# Patient Record
Sex: Male | Born: 1950 | Race: Black or African American | Hispanic: No | Marital: Single | Smoking: Never smoker
Health system: Southern US, Community
[De-identification: ages and names within clinical notes are randomized; demographics above are authoritative.]

## PROBLEM LIST (undated history)

## (undated) DIAGNOSIS — I10 Essential (primary) hypertension: Secondary | ICD-10-CM

## (undated) DIAGNOSIS — A6002 Herpesviral infection of other male genital organs: Secondary | ICD-10-CM

## (undated) DIAGNOSIS — E785 Hyperlipidemia, unspecified: Secondary | ICD-10-CM

## (undated) DIAGNOSIS — N529 Male erectile dysfunction, unspecified: Secondary | ICD-10-CM

## (undated) DIAGNOSIS — G4730 Sleep apnea, unspecified: Secondary | ICD-10-CM

## (undated) HISTORY — DX: Essential (primary) hypertension: I10

## (undated) HISTORY — DX: Herpesviral infection of other male genital organs: A60.02

## (undated) HISTORY — DX: Male erectile dysfunction, unspecified: N52.9

## (undated) HISTORY — DX: Hyperlipidemia, unspecified: E78.5

## (undated) HISTORY — DX: Sleep apnea, unspecified: G47.30

## (undated) HISTORY — PX: COLONOSCOPY: SHX174

---

## 2001-06-25 ENCOUNTER — Emergency Department (HOSPITAL_COMMUNITY): Admission: EM | Admit: 2001-06-25 | Discharge: 2001-06-25 | Payer: Self-pay | Admitting: Emergency Medicine

## 2001-08-31 ENCOUNTER — Emergency Department (HOSPITAL_COMMUNITY): Admission: EM | Admit: 2001-08-31 | Discharge: 2001-08-31 | Payer: Self-pay | Admitting: Emergency Medicine

## 2001-08-31 ENCOUNTER — Encounter: Payer: Self-pay | Admitting: Emergency Medicine

## 2001-09-05 ENCOUNTER — Ambulatory Visit (HOSPITAL_COMMUNITY): Admission: RE | Admit: 2001-09-05 | Discharge: 2001-09-05 | Payer: Self-pay | Admitting: Specialist

## 2001-09-05 ENCOUNTER — Encounter: Payer: Self-pay | Admitting: Specialist

## 2005-06-14 ENCOUNTER — Encounter: Admission: RE | Admit: 2005-06-14 | Discharge: 2005-07-01 | Payer: Self-pay | Admitting: Family Medicine

## 2005-11-23 ENCOUNTER — Encounter: Admission: RE | Admit: 2005-11-23 | Discharge: 2005-11-23 | Payer: Self-pay | Admitting: Family Medicine

## 2006-05-19 ENCOUNTER — Ambulatory Visit: Payer: Self-pay | Admitting: Family Medicine

## 2006-05-23 HISTORY — PX: CARDIAC CATHETERIZATION: SHX172

## 2006-05-30 LAB — HM COLONOSCOPY: HM Colonoscopy: NORMAL

## 2006-06-01 ENCOUNTER — Ambulatory Visit: Payer: Self-pay | Admitting: Internal Medicine

## 2006-06-02 ENCOUNTER — Ambulatory Visit: Payer: Self-pay | Admitting: Internal Medicine

## 2006-06-02 LAB — CONVERTED CEMR LAB
BUN: 17 mg/dL (ref 6–23)
CO2: 29 meq/L (ref 19–32)
Calcium: 9.6 mg/dL (ref 8.4–10.5)
Chloride: 104 meq/L (ref 96–112)
Creatinine, Ser: 1.2 mg/dL (ref 0.4–1.5)
GFR calc non Af Amer: 67 mL/min
Glomerular Filtration Rate, Af Am: 81 mL/min/{1.73_m2}
Glucose, Bld: 117 mg/dL — ABNORMAL HIGH (ref 70–99)
HCT: 40.1 % (ref 39.0–52.0)
Hemoglobin: 13.9 g/dL (ref 13.0–17.0)
INR: 0.9 (ref 0.9–2.0)
MCHC: 34.6 g/dL (ref 30.0–36.0)
MCV: 94.3 fL (ref 78.0–100.0)
Platelets: 233 10*3/uL (ref 150–400)
Potassium: 3.9 meq/L (ref 3.5–5.1)
Prothrombin Time: 12 s (ref 10.0–14.0)
RBC: 4.25 M/uL (ref 4.22–5.81)
RDW: 11.8 % (ref 11.5–14.6)
Sodium: 139 meq/L (ref 135–145)
WBC: 7.1 10*3/uL (ref 4.5–10.5)
aPTT: 29.3 s (ref 26.5–36.5)

## 2006-06-03 ENCOUNTER — Inpatient Hospital Stay (HOSPITAL_BASED_OUTPATIENT_CLINIC_OR_DEPARTMENT_OTHER): Admission: RE | Admit: 2006-06-03 | Discharge: 2006-06-03 | Payer: Self-pay | Admitting: Internal Medicine

## 2006-06-03 ENCOUNTER — Ambulatory Visit: Payer: Self-pay | Admitting: Internal Medicine

## 2006-06-07 ENCOUNTER — Ambulatory Visit (HOSPITAL_COMMUNITY): Admission: RE | Admit: 2006-06-07 | Discharge: 2006-06-07 | Payer: Self-pay | Admitting: Gastroenterology

## 2006-06-14 ENCOUNTER — Ambulatory Visit: Payer: Self-pay | Admitting: Internal Medicine

## 2007-05-22 ENCOUNTER — Ambulatory Visit: Payer: Self-pay | Admitting: Family Medicine

## 2007-09-21 ENCOUNTER — Ambulatory Visit: Payer: Self-pay | Admitting: Family Medicine

## 2008-02-02 ENCOUNTER — Ambulatory Visit: Payer: Self-pay | Admitting: Family Medicine

## 2008-05-27 ENCOUNTER — Ambulatory Visit: Payer: Self-pay | Admitting: Family Medicine

## 2008-06-03 ENCOUNTER — Ambulatory Visit: Payer: Self-pay | Admitting: Family Medicine

## 2008-09-30 ENCOUNTER — Ambulatory Visit: Payer: Self-pay | Admitting: Family Medicine

## 2009-07-07 ENCOUNTER — Ambulatory Visit: Payer: Self-pay | Admitting: Family Medicine

## 2010-04-15 ENCOUNTER — Ambulatory Visit: Payer: Self-pay | Admitting: Family Medicine

## 2010-07-21 ENCOUNTER — Ambulatory Visit: Payer: Self-pay | Admitting: Family Medicine

## 2010-08-03 ENCOUNTER — Ambulatory Visit: Payer: Self-pay | Admitting: Family Medicine

## 2010-08-03 ENCOUNTER — Encounter
Admission: RE | Admit: 2010-08-03 | Discharge: 2010-08-03 | Payer: Self-pay | Source: Home / Self Care | Attending: Family Medicine | Admitting: Family Medicine

## 2010-08-10 ENCOUNTER — Ambulatory Visit: Payer: Self-pay | Admitting: Family Medicine

## 2010-12-11 ENCOUNTER — Ambulatory Visit: Payer: Self-pay | Admitting: Family Medicine

## 2010-12-16 ENCOUNTER — Ambulatory Visit (INDEPENDENT_AMBULATORY_CARE_PROVIDER_SITE_OTHER): Payer: PRIVATE HEALTH INSURANCE | Admitting: Family Medicine

## 2010-12-16 DIAGNOSIS — Z79899 Other long term (current) drug therapy: Secondary | ICD-10-CM

## 2010-12-16 DIAGNOSIS — I10 Essential (primary) hypertension: Secondary | ICD-10-CM

## 2010-12-16 DIAGNOSIS — E782 Mixed hyperlipidemia: Secondary | ICD-10-CM

## 2010-12-16 DIAGNOSIS — E119 Type 2 diabetes mellitus without complications: Secondary | ICD-10-CM

## 2011-01-08 NOTE — Assessment & Plan Note (Signed)
[facility]                                   ON-CALL NOTE   NAME:Jacob Swanson, Jacob Swanson                         MRN:          [id number]  DATE:06/01/2006                            DOB:          31-Jul-1951    PRIMARY CARDIOLOGIST:  Jonelle Sidle, MD.   [name] called this evening because he was told earlier by Dr. Gala Romney  that he had an abnormal stress test and would require left heart cardiac  catheterization on Friday.  He initially was scheduled to have laboratory  work drawn tomorrow at 0830 hours, at the office, however Mr. Life has now  realized that he had a previous engagement, until about 1530 hours tomorrow.  I advised that I would call our office and leave a message, which I have  already done, to inform them that he would not be there at 0830 hours and to  expect him at approximately 1600 hours for laboratory work.   He also had a question regarding the catheterization procedure.  I spent  about 15 minutes on the phone with him, explaining the procedure, what we  are looking for, and what we do if we find coronary disease.  He verbalized  understanding and was grateful for the time spent on the phone.      ______________________________  Nicolasa Ducking, ANP    ______________________________  Jonelle Sidle, MD     CB/MedQ  DD:  06/01/2006  DT:  06/03/2006  Job #:  [id number]

## 2011-01-08 NOTE — Assessment & Plan Note (Signed)
[facility]                              CARDIOLOGY OFFICE NOTE   NAME:Jacob Swanson, Jacob Swanson                         MRN:          [id number]  DATE:06/14/2006                            DOB:          09-Apr-1951    This is a 60 year old African-American male patient of Dr. Gala Romney and  Sharlot Gowda, M.D. who presented with some dyspnea on exertion.  He is a  very active runner, running 5-6 miles several days a week.  He had a stress  test in which he exercised 10 minutes of the Bruce protocol, but had some  mild ST elevation, aVR and aVL, and mild ST depression in leads 3 and aVL.  Perfusion imaging was totally normal, however, there was mild dilatation of  the ventricle with slightly low normal ejection fraction at 50% with global  hypokinesis.  There was no transient ischemic dilatation, but given the  results, there was some concern about global ischemia and potential three  vessel or left main disease.  Because of this, cardiac catheterization was  recommended and performed on June 03, 2006.  This revealed totally normal  coronary arteries and LV function.   The patient is doing well, no chest pain.  He has mild shortness of breath  when he goes up a flight of stairs, but he has no concerns.   CURRENT MEDICATIONS:  1. Zestoretic 20/12.5 mg daily.  2. Zocor 20 mg daily.  3. Metformin 500 mg b.i.d.   PHYSICAL EXAMINATION:  GENERAL:  This is a pleasant 60 year old African-  American male in no acute distress.  VITAL SIGNS:  Blood pressure 144/90, pulse 76, weight 199.  NECK:  Without JVD, HDR, bruit, or thyroid enlargement.  LUNGS:  Clear anterior, posterior, and lateral.  HEART:  Regular rate and rhythm at 76 beats per minute.  Normal S1 and S2.  No murmur, rub, bruit, thrill, or heave noted.  ABDOMEN:  Soft without organomegaly, masses, lesions, or abnormal  tenderness.  Right groin is stable without hematoma or hemorrhage.  EXTREMITIES:   Without cyanosis, clubbing, or edema.  There are good distal  pulses.   IMPRESSION:  1. False positive stress Cardiolite with normal coronary arteries and left      ventricular function on catheterization on June 03, 2006.  2. Hypertension.  3. Hyperlipidemia.  4. Diabetes mellitus.   PLAN:  The patient was rushing here and was in bad traffic and claims this  is why his blood pressure is up.  I will let Dr. Susann Givens follow both his  blood pressure and diabetes, and hyperlipidemia and he can follow up with [place]  as needed.     ______________________________  Jacolyn Reedy, PA-C    ______________________________  Bevelyn Buckles. Bensimhon, MD   ML/MedQ  DD: 06/14/2006  DT: 06/15/2006  Job #: [id number]   cc:   Sharlot Gowda, M.D.

## 2011-01-08 NOTE — Assessment & Plan Note (Signed)
[facility]                              CARDIOLOGY OFFICE NOTE   NAME:Gonce, MAHAD                         MRN:          [id number]  DATE:06/03/2006                            DOB:          02/02/1951    ADDENDUM   d   PHYSICAL EXAMINATION:  GENERAL APPEARANCE:  The patient is well-appearing,  in no acute distress.  Able to ambulate without any respiratory difficulty.  VITAL SIGNS:  Blood pressure 152/91.  Heart rate 68.  He is sating 99% on  room air.  HEENT:  Sclerae anicteric.  EOMI.  There is no xanthelasma.  His mucous  membranes are moist.  NECK:  Supple, no JVD.  Carotids are 2+ bilaterally without bruits.  There  is no lymphadenopathy or thyromegaly.  CARDIAC:  He is bradycardic and regular with no murmurs, rubs or gallops.  LUNGS:  Clear.  ABDOMEN:  Soft, nontender, nondistended.  No hepatosplenomegaly, no bruits,  no masses.  Good bowel sounds.  EXTREMITIES:  Warm with no cyanosis, clubbing or edema.  Femoral pulses are  2+ bilaterally without any bruits.  Dorsalis pedis  pulses are 2+.  NEUROLOGICAL:  He is alert and oriented x3.  Pleasant affect.  Cranial  nerves II-XII are intact.  Moves all four extremities without difficulty.  SKIN:  There are no rashes or significant arthropathies.       Bevelyn Buckles. Bensimhon, MD     DRB/MedQ  DD:  06/03/2006  DT:  06/04/2006  Job #:  [id number]

## 2011-01-08 NOTE — Assessment & Plan Note (Signed)
[facility]                              CARDIOLOGY OFFICE NOTE   NAME:RUCKERElizeo, Rodriques                         MRN:          [id number]  DATE:06/01/2006                            DOB:          1951-02-02    CONSULTATION   PRIMARY CARE PHYSICIAN:  Sharlot Gowda, M.D.   REASON FOR CONSULTATION:  Abnormal stress test.   HISTORY OF PRESENT ILLNESS:  Mr. Groeneveld is a delightful 60 year old male  with a history of hypertension, hyperlipidemia and diabetes.  He denies any  known history or family history of coronary artery disease.  He has been  quite active recently running in multiple local running races.  He has not  had any significant problems with this.  However, he has noticed that he  does get some dyspnea while walking up steps.  He went to Dr. Susann Givens for  his routine physical and given his risk factors, he was scheduled for  treadmill Myoview test.  This test was performed on Wednesday.  During the  test he walked over 10 minutes with standard Bruce protocol.  He did not  have any chest pain.  However, at peak exercise there was mild ST elevation  in aVR and aVL.  There was also some mild ST depression in leads III and  aVL.  Perfusion imaging was totally normal, however, there was mild dilation  of the ventricle with slightly low normal ejection fraction at 50% with  global hypokinesis.  There was no transient ischemic dilatation.  Given the  results of his tests, there was some concern, albeit mild, about global  ischemia and potential of three vessel or left main disease.  I called him  at home to discuss this and expressed my concern.  I also reinforced the  fact that these findings were somewhat equivocal.  Given his risk factors  and level of activity, I thought it was prudent to proceed with cardiac  catheterization to make sure he did not have a high grade proximal lesion.   REVIEW OF SYSTEMS:  He notes some mild arthralgias as well as  sexual  dysfunction.  The remainder of his review of systems is negative.  He denies  palpitations. He has not had chest pain.  No heart failure symptoms.  No  claudication.  No bright red blood per rectum.  No bleeding, no melena.  No  neurological problems.   PAST MEDICAL HISTORY:  1. Diabetes.  2. Hypertension.  3. Hyperlipidemia.   CURRENT MEDICATIONS:  1. Zestoretic 20/12.5.  2. Zocor 20.  3. Metformin ER 500 mg b.i.d.  4. Aspirin 81 mg.   ALLERGIES:  No known drug allergies.   SOCIAL HISTORY:  The patient is single.  He lives in [place].  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  He has a  history of smoking; he smoked about 1 pack per day for two years and quit in  2004.  He drinks alcohol about one drink a day.  He is very active running  in local running races, doing step exercises and lifting weights.   FAMILY HISTORY:  Mother died at 36 due to kidney problems.  Father is 34 and  alive and well.  He works every day.  He does not have any history of heart  disease.  He has two sisters and two brothers who are healthy.  There is no  family history of sudden cardiac death or premature coronary artery disease.   PHYSICAL EXAMINATION:  GENERAL APPEARANCE:  The patient is well-appearing,  in no acute distress.  Able to ambulate without any respiratory difficulty.  VITAL SIGNS:  Blood pressure 152/91.  Heart rate 68.  He is sating 99% on  room air.  HEENT:  Sclerae anicteric.  EOMI.  There is no xanthelasma.  His mucous  membranes are moist.  NECK:  Supple, no JVD.  Carotids are 2+ bilaterally without bruits.  There  is no lymphadenopathy or thyromegaly.  CARDIAC:  He is bradycardic and regular with no murmurs, rubs or gallops.  LUNGS:  Clear.  ABDOMEN:  Soft, nontender, nondistended.  No hepatosplenomegaly, no bruits,  no masses.  Good bowel sounds.  EXTREMITIES:  Warm  with no cyanosis, clubbing or edema.  Femoral pulses are  2+ bilaterally without any bruits.  Dorsalis pedis  pulses are 2+.  NEUROLOGICAL:  He is alert and oriented x3.  Pleasant affect.  Cranial  nerves II-XII are intact.  Moves all four extremities without difficulty.  SKIN:  There are no rashes or significant arthropathies.   CLINICAL DATA:  EKG shows sinus bradycardia with a first degree AV block at  a rate of 57.  There are no significant ST-T wave changes.  Sodium is 139,  potassium 3.9, chloride 104, bicarb 29, glucose 117, BUN 17, creatinine 1.2.  INR is 0.9.  The white cell count is 7.1, hemoglobin 13.9, platelet count  233,000.   ASSESSMENT:  1. Abnormal stress test as described above.  2. Hypertension.  3. Hyperlipidemia.  4. Diabetes.   PLAN/DISCUSSION:  As above, despite Mr. Munch excellent apparent physical  condition, he does have a modestly abnormal Myoview with some concern for  global ischemia.  Given his risk factors and high level of activity that he  would like maintain, I do think it is prudent to proceed  with cardiac catheterization to evaluate for any possible high grade  proximal lesions.  I discussed the risks and benefits of this with him at  length and he agrees to proceed.       Bevelyn Buckles. Bensimhon, MD    ZOX#[id number]  DRB/MedQ  DD:  06/03/2006  DT:  06/04/2006  Job #:  [id number]   cc:   Sharlot Gowda, M.D.

## 2011-01-08 NOTE — Cardiovascular Report (Signed)
NAME:  Jacob Swanson, Jacob Swanson NO.:  [id number]   MEDICAL RECORD NO.:  [id number]          PATIENT TYPE:  OIB   LOCATION:  1962                         FACILITY:  [facility]   PHYSICIAN:  Bevelyn Buckles. Bensimhon, MDDATE OF BIRTH:  22-Dec-1950   DATE OF PROCEDURE:  06/03/2006  DATE OF DISCHARGE:  06/03/2006                              CARDIAC CATHETERIZATION   PRIMARY CARE PHYSICIAN:  Dr. Sharlot Gowda.   PATIENT IDENTIFICATION:  Jacob Swanson is a 60 year old male with history of  hypertension, hyperlipidemia and diabetes.  He was referred for routine  stress testing.  This was mildly abnormal and he is thus referred for  diagnostic cardiac catheterization in the outpatient catheterization lab.   PROCEDURES PERFORMED:  1. Selective coronary angiography.  2. Left heart cath.  3. Left ventriculogram.   DESCRIPTION OF PROCEDURE:  The risks and benefits of catheterization were  explained.  Consent was signed and placed on the chart.  A 4-French sheath  was placed in the right femoral artery using the modified Seldinger  technique.  Standard catheters including JL-5, 3DRC and angled pigtail were  used for procedure.  All catheter exchanges made over wire.  There were no  apparent complications.  At the end of the procedure, patient was  transferred to a holding area in stable condition.   Central aortic pressure 136/81 with a mean of 104.  LV pressure 133/7 with  EDP of 18.  There is no aortic stenosis.   Left main was short, angiographically normal.   LAD was a long vessel wrapping the apex.  There was 2 tiny diagonals.  There  is no angiographic disease.   Left circumflex was a large co-dominant vessel, gave off a large OM-1, a  large OM-2, moderate-sized OM-3, a moderate-sized posterolateral and a small  PDA.   Right coronary artery was a moderate-size codominant vessel, gave off a  large branching RV branch and a small PDA.   Left ventriculogram done on the RAO position  showed an EF of 55% with no  wall motion abnormalities or mitral regurgitation.  On panning down over the  abdominal aorta, there is no evidence of aneurysmal dilatation.   ASSESSMENT:  1. Normal coronary arteries.  2. Normal LV function.   PLAN/DISCUSSION:  It appears that he has had a false positive stress test.  He has normal coronary anatomy and normal LV function.  Would proceed with  risk factor management per Dr. Susann Givens.  I have reassured the patient and  asked that he not resume running for about a week, to let his arteriotomy  site time to heal.      Bevelyn Buckles. Bensimhon, MD  Electronically Signed     DRB/MEDQ  D:  06/03/2006  T:  06/04/2006  Job:  [id number]   cc:   Sharlot Gowda, M.D.

## 2011-04-20 ENCOUNTER — Encounter: Payer: Self-pay | Admitting: Family Medicine

## 2011-04-21 ENCOUNTER — Encounter: Payer: Self-pay | Admitting: Family Medicine

## 2011-04-21 ENCOUNTER — Ambulatory Visit (INDEPENDENT_AMBULATORY_CARE_PROVIDER_SITE_OTHER): Payer: BC Managed Care – PPO | Admitting: Family Medicine

## 2011-04-21 DIAGNOSIS — I152 Hypertension secondary to endocrine disorders: Secondary | ICD-10-CM

## 2011-04-21 DIAGNOSIS — Z23 Encounter for immunization: Secondary | ICD-10-CM

## 2011-04-21 DIAGNOSIS — E119 Type 2 diabetes mellitus without complications: Secondary | ICD-10-CM

## 2011-04-21 DIAGNOSIS — Z79899 Other long term (current) drug therapy: Secondary | ICD-10-CM

## 2011-04-21 DIAGNOSIS — Z139 Encounter for screening, unspecified: Secondary | ICD-10-CM

## 2011-04-21 DIAGNOSIS — E1159 Type 2 diabetes mellitus with other circulatory complications: Secondary | ICD-10-CM | POA: Insufficient documentation

## 2011-04-21 DIAGNOSIS — I10 Essential (primary) hypertension: Secondary | ICD-10-CM

## 2011-04-21 DIAGNOSIS — E118 Type 2 diabetes mellitus with unspecified complications: Secondary | ICD-10-CM | POA: Insufficient documentation

## 2011-04-21 DIAGNOSIS — E785 Hyperlipidemia, unspecified: Secondary | ICD-10-CM

## 2011-04-21 DIAGNOSIS — E1169 Type 2 diabetes mellitus with other specified complication: Secondary | ICD-10-CM | POA: Insufficient documentation

## 2011-04-21 LAB — LIPID PANEL
Cholesterol: 198 mg/dL (ref 0–200)
Total CHOL/HDL Ratio: 4.1 Ratio
VLDL: 15 mg/dL (ref 0–40)

## 2011-04-21 NOTE — Progress Notes (Signed)
  Subjective:    Patient ID: Jacob Swanson, male    DOB: March 06, 1951, 60 y.o.   MRN: [id number]  HPI He is here for a diabetes recheck. He continues to take excellent care of himself. His blood sugars run in the low 100s. He exercises regularly. He continues on medications listed in the chart. He has had an eye exam within the last year. He does check his feet regularly. He does not smoke and drinks socially. His work continues to go well. His statin was increased with his last visit. He had no difficulty with this.   Review of Systems     Objective:   Physical Exam Alert and in no distress. Hemoglobin A1c is 6.5       Assessment & Plan:  Diabetes. Hypertension. Hyperlipidemia. Flu shot given Continue take excellent care of himself. Return here in 4 months.

## 2011-04-22 LAB — HEPATITIS B SURFACE ANTIBODY,QUALITATIVE: Hep B S Ab: NEGATIVE

## 2011-07-01 ENCOUNTER — Other Ambulatory Visit: Payer: Self-pay | Admitting: Family Medicine

## 2011-07-13 ENCOUNTER — Encounter: Payer: Self-pay | Admitting: Family Medicine

## 2011-07-13 ENCOUNTER — Ambulatory Visit (INDEPENDENT_AMBULATORY_CARE_PROVIDER_SITE_OTHER): Payer: BC Managed Care – PPO | Admitting: Family Medicine

## 2011-07-13 VITALS — BP 118/80 | HR 64 | Ht 69.0 in | Wt 200.0 lb

## 2011-07-13 DIAGNOSIS — N529 Male erectile dysfunction, unspecified: Secondary | ICD-10-CM

## 2011-07-13 DIAGNOSIS — I152 Hypertension secondary to endocrine disorders: Secondary | ICD-10-CM

## 2011-07-13 DIAGNOSIS — Z2911 Encounter for prophylactic immunotherapy for respiratory syncytial virus (RSV): Secondary | ICD-10-CM

## 2011-07-13 DIAGNOSIS — E119 Type 2 diabetes mellitus without complications: Secondary | ICD-10-CM

## 2011-07-13 DIAGNOSIS — I10 Essential (primary) hypertension: Secondary | ICD-10-CM

## 2011-07-13 DIAGNOSIS — E785 Hyperlipidemia, unspecified: Secondary | ICD-10-CM

## 2011-07-13 DIAGNOSIS — Z0000 Encounter for general adult medical examination without abnormal findings: Secondary | ICD-10-CM

## 2011-07-13 DIAGNOSIS — G4730 Sleep apnea, unspecified: Secondary | ICD-10-CM | POA: Insufficient documentation

## 2011-07-13 LAB — POCT URINALYSIS DIPSTICK
Glucose, UA: NEGATIVE
Ketones, UA: NEGATIVE
Leukocytes, UA: NEGATIVE
Protein, UA: NEGATIVE
Spec Grav, UA: 1.02
Urobilinogen, UA: NEGATIVE

## 2011-07-13 LAB — COMPREHENSIVE METABOLIC PANEL
Albumin: 4.6 g/dL (ref 3.5–5.2)
Alkaline Phosphatase: 78 U/L (ref 39–117)
BUN: 14 mg/dL (ref 6–23)
CO2: 27 mEq/L (ref 19–32)
Calcium: 9.7 mg/dL (ref 8.4–10.5)
Chloride: 101 mEq/L (ref 96–112)
Glucose, Bld: 118 mg/dL — ABNORMAL HIGH (ref 70–99)
Potassium: 4.4 mEq/L (ref 3.5–5.3)
Sodium: 137 mEq/L (ref 135–145)
Total Protein: 7.4 g/dL (ref 6.0–8.3)

## 2011-07-13 LAB — POC HEMOCCULT BLD/STL (OFFICE/1-CARD/DIAGNOSTIC): Fecal Occult Blood, POC: NEGATIVE

## 2011-07-13 LAB — POCT UA - MICROALBUMIN
Albumin/Creatinine Ratio, Urine, POC: 6.7
Microalbumin Ur, POC: 1.9 mg/dL

## 2011-07-13 LAB — LIPID PANEL
HDL: 51 mg/dL (ref >39)
LDL Cholesterol: 163 mg/dL — ABNORMAL HIGH (ref 0–99)
Triglycerides: 104 mg/dL (ref <150)

## 2011-07-13 MED ORDER — ROSUVASTATIN CALCIUM 20 MG PO TABS: 20.0000 mg | ORAL_TABLET | Freq: Every day | ORAL | Status: DC

## 2011-07-13 MED ORDER — METFORMIN HCL 850 MG PO TABS: 850.0000 mg | ORAL_TABLET | Freq: Two times a day (BID) | ORAL | Status: DC

## 2011-07-13 MED ORDER — OLMESARTAN-AMLODIPINE-HCTZ 20-5-12.5 MG PO TABS: 1.0000 | ORAL_TABLET | Freq: Every day | ORAL | Status: DC

## 2011-07-13 NOTE — Progress Notes (Signed)
  Subjective:    Patient ID: Jacob Swanson, male    DOB: 1951/08/19, 60 y.o.   MRN: [id number]  HPI He is here for a complete examination. He continues to take excellent care of himself in regard to his diabetes. He is using his CPAP and finds it quite useful. He does occasionally have difficulty with erectile dysfunction and uses ED medications appropriately. 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 He does complain of right earache for the last several months. No fever, chills or sore throat. He had an eye examination in February. He does check his feet periodically. His social and family history were reviewed. Work is going well. He does wear her seatbelt   Review of Systems Negative except as above    Objective:   Physical Exam BP 118/80  Pulse 64  Ht 5\' 9"  (1.753 m)  Wt 200 lb (90.719 kg)  BMI 29.53 kg/m2  General Appearance:    Alert, cooperative, no distress, appears stated age  Head:    Normocephalic, without obvious abnormality, atraumatic  Eyes:    PERRL, conjunctiva/corneas clear, EOM's intact, fundi    benign  Ears:    Normal TM's and external ear canals  Nose:   Nares normal, mucosa normal, no drainage or sinus   tenderness  Throat:   Lips, mucosa, and tongue normal; teeth and gums normal  Neck:   Supple, no lymphadenopathy;  thyroid:  no   enlargement/tenderness/nodules; no carotid   bruit or JVD  Back:    Spine nontender, no curvature, ROM normal, no CVA     tenderness  Lungs:     Clear to auscultation bilaterally without wheezes, rales or     ronchi; respirations unlabored  Chest Wall:    No tenderness or deformity   Heart:    Regular rate and rhythm, S1 and S2 normal, no murmur, rub   or gallop  Breast Exam:    No chest wall tenderness, masses or gynecomastia  Abdomen:     Soft, non-tender, nondistended, normoactive bowel sounds,    no masses, no hepatosplenomegaly  Genitalia:    Normal male external genitalia without  lesions.  Testicles without masses.  No inguinal hernias.  Rectal:    Normal sphincter tone, no masses or tenderness; guaiac negative stool.  Prostate smooth, no nodules, not enlarged.  Extremities:   No clubbing, cyanosis or edema  Pulses:   2+ and symmetric all extremities  Skin:   Skin color, texture, turgor normal, no rashes or lesions  Lymph nodes:   Cervical, supraclavicular, and axillary nodes normal  Neurologic:   CNII-XII intact, normal strength, sensation and gait; reflexes 2+ and symmetric throughout          Psych:   Normal mood, affect, hygiene and grooming.           Assessment & Plan:   1. Physical exam, annual  POCT Urinalysis Dipstick, CBC with Differential, Comprehensive metabolic panel, Lipid panel  2. Sleep apnea    3. Diabetes mellitus  CBC with Differential, Comprehensive metabolic panel, Lipid panel, POCT UA - Microalbumin  4. Hyperlipidemia LDL goal < 70  Lipid panel  5. Hypertension associated with diabetes  CBC with Differential, Comprehensive metabolic panel, Lipid panel  6. ED (erectile dysfunction)     Was also given a Zostavax shot. The risks and benefits of the shot work discussed with the patient.

## 2011-07-14 LAB — CBC WITH DIFFERENTIAL/PLATELET
HCT: 44.2 % (ref 39.0–52.0)
Hemoglobin: 14.2 g/dL (ref 13.0–17.0)
Lymphocytes Relative: 43 % (ref 12–46)
Lymphs Abs: 2.8 10*3/uL (ref 0.7–4.0)
MCHC: 32.1 g/dL (ref 30.0–36.0)
Monocytes Absolute: 0.4 10*3/uL (ref 0.1–1.0)
Monocytes Relative: 5 % (ref 3–12)
Neutro Abs: 3.2 10*3/uL (ref 1.7–7.7)
Neutrophils Relative %: 50 % (ref 43–77)
RBC: 4.63 MIL/uL (ref 4.22–5.81)
WBC: 6.5 10*3/uL (ref 4.0–10.5)

## 2011-07-19 ENCOUNTER — Other Ambulatory Visit: Payer: Self-pay | Admitting: Internal Medicine

## 2011-07-19 MED ORDER — ROSUVASTATIN CALCIUM 40 MG PO TABS: 40.0000 mg | ORAL_TABLET | Freq: Every day | ORAL | Status: DC

## 2011-07-19 NOTE — Telephone Encounter (Signed)
Called in med since eprescribe is down

## 2011-07-19 NOTE — Telephone Encounter (Signed)
Changed pt crestor from 20mg  to crestor 40mg  per Dr. Susann Givens

## 2011-07-24 ENCOUNTER — Other Ambulatory Visit: Payer: Self-pay | Admitting: Family Medicine

## 2011-07-26 ENCOUNTER — Telehealth: Payer: Self-pay | Admitting: Family Medicine

## 2011-07-26 MED ORDER — GLUCOSE BLOOD VI STRP: ORAL_STRIP | Status: DC

## 2011-07-26 NOTE — Telephone Encounter (Signed)
refilled 

## 2011-07-26 NOTE — Telephone Encounter (Signed)
Strips refilled

## 2011-08-12 ENCOUNTER — Encounter: Payer: Self-pay | Admitting: Family Medicine

## 2011-08-12 ENCOUNTER — Ambulatory Visit (INDEPENDENT_AMBULATORY_CARE_PROVIDER_SITE_OTHER): Payer: BC Managed Care – PPO | Admitting: Family Medicine

## 2011-08-12 VITALS — BP 128/90 | HR 65 | Wt 200.0 lb

## 2011-08-12 DIAGNOSIS — E1169 Type 2 diabetes mellitus with other specified complication: Secondary | ICD-10-CM

## 2011-08-12 DIAGNOSIS — I152 Hypertension secondary to endocrine disorders: Secondary | ICD-10-CM

## 2011-08-12 DIAGNOSIS — E785 Hyperlipidemia, unspecified: Secondary | ICD-10-CM

## 2011-08-12 DIAGNOSIS — E1159 Type 2 diabetes mellitus with other circulatory complications: Secondary | ICD-10-CM

## 2011-08-12 DIAGNOSIS — I10 Essential (primary) hypertension: Secondary | ICD-10-CM

## 2011-08-12 DIAGNOSIS — E119 Type 2 diabetes mellitus without complications: Secondary | ICD-10-CM

## 2011-08-12 MED ORDER — SILDENAFIL CITRATE 100 MG PO TABS: 100.0000 mg | ORAL_TABLET | Freq: Every day | ORAL | Status: DC | PRN

## 2011-08-12 NOTE — Patient Instructions (Signed)
Continue to take good care of yourself

## 2011-08-12 NOTE — Progress Notes (Signed)
  Subjective:    Patient ID: Jacob Swanson, male    DOB: [birth year], 60 y.o.   MRN: [id number]  HPI He is here for a recheck on his diabetes. He recently had a complete exam. He continues on medications listed in the chart. He exercises regularly. He does not smoke. Drinking is mainly social. His work continues to go well. He had an eye exam scheduled in February. He does check his feet regularly.   Review of Systems     Objective:   Physical Exam Alert and in no distress otherwise not examined. Her goal A1c is 6.7       Assessment & Plan:   1. Type II or unspecified type diabetes mellitus without mention of complication, not stated as uncontrolled  POCT HgB A1C  2. Diabetes mellitus    3. Hyperlipidemia LDL goal < 70    4. Hypertension associated with diabetes

## 2011-09-12 ENCOUNTER — Other Ambulatory Visit: Payer: Self-pay | Admitting: Family Medicine

## 2012-06-22 ENCOUNTER — Encounter: Payer: BC Managed Care – PPO | Admitting: Family Medicine

## 2012-07-13 ENCOUNTER — Encounter: Payer: BC Managed Care – PPO | Admitting: Family Medicine

## 2012-07-19 ENCOUNTER — Encounter: Payer: Self-pay | Admitting: Internal Medicine

## 2012-08-08 ENCOUNTER — Ambulatory Visit (INDEPENDENT_AMBULATORY_CARE_PROVIDER_SITE_OTHER): Payer: BC Managed Care – PPO | Admitting: Family Medicine

## 2012-08-08 ENCOUNTER — Encounter: Payer: Self-pay | Admitting: Family Medicine

## 2012-08-08 VITALS — BP 122/80 | HR 78 | Ht 69.0 in | Wt 202.0 lb

## 2012-08-08 DIAGNOSIS — E119 Type 2 diabetes mellitus without complications: Secondary | ICD-10-CM

## 2012-08-08 DIAGNOSIS — Z79899 Other long term (current) drug therapy: Secondary | ICD-10-CM

## 2012-08-08 DIAGNOSIS — Z23 Encounter for immunization: Secondary | ICD-10-CM

## 2012-08-08 DIAGNOSIS — E1169 Type 2 diabetes mellitus with other specified complication: Secondary | ICD-10-CM

## 2012-08-08 DIAGNOSIS — I152 Hypertension secondary to endocrine disorders: Secondary | ICD-10-CM

## 2012-08-08 DIAGNOSIS — Z0000 Encounter for general adult medical examination without abnormal findings: Secondary | ICD-10-CM

## 2012-08-08 DIAGNOSIS — I10 Essential (primary) hypertension: Secondary | ICD-10-CM

## 2012-08-08 DIAGNOSIS — G4730 Sleep apnea, unspecified: Secondary | ICD-10-CM

## 2012-08-08 DIAGNOSIS — E785 Hyperlipidemia, unspecified: Secondary | ICD-10-CM

## 2012-08-08 LAB — HEMOCCULT GUIAC POC 1CARD (OFFICE)

## 2012-08-08 LAB — CBC WITH DIFFERENTIAL/PLATELET
Hemoglobin: 13.3 g/dL (ref 13.0–17.0)
Lymphocytes Relative: 49 % — ABNORMAL HIGH (ref 12–46)
Lymphs Abs: 3.3 10*3/uL (ref 0.7–4.0)
MCV: 89.2 fL (ref 78.0–100.0)
Monocytes Relative: 6 % (ref 3–12)
Neutrophils Relative %: 42 % — ABNORMAL LOW (ref 43–77)
Platelets: 275 10*3/uL (ref 150–400)
RBC: 4.35 MIL/uL (ref 4.22–5.81)
WBC: 6.7 10*3/uL (ref 4.0–10.5)

## 2012-08-08 LAB — POCT URINALYSIS DIPSTICK
Glucose, UA: NEGATIVE
Ketones, UA: NEGATIVE
Leukocytes, UA: NEGATIVE
Protein, UA: NEGATIVE
Spec Grav, UA: 1.025

## 2012-08-08 LAB — POCT UA - MICROALBUMIN
Albumin/Creatinine Ratio, Urine, POC: 5.3
Microalbumin Ur, POC: 23 mg/dL

## 2012-08-08 LAB — COMPREHENSIVE METABOLIC PANEL
ALT: 21 U/L (ref 0–53)
AST: 21 U/L (ref 0–37)
Alkaline Phosphatase: 70 U/L (ref 39–117)
Glucose, Bld: 118 mg/dL — ABNORMAL HIGH (ref 70–99)
Sodium: 140 mEq/L (ref 135–145)
Total Bilirubin: 0.7 mg/dL (ref 0.3–1.2)
Total Protein: 7.1 g/dL (ref 6.0–8.3)

## 2012-08-08 LAB — LIPID PANEL
Total CHOL/HDL Ratio: 4.3 Ratio
VLDL: 13 mg/dL (ref 0–40)

## 2012-08-08 LAB — POCT GLYCOSYLATED HEMOGLOBIN (HGB A1C): Hemoglobin A1C: 6.8

## 2012-08-08 MED ORDER — ROSUVASTATIN CALCIUM 40 MG PO TABS: 40.0000 mg | ORAL_TABLET | Freq: Every day | ORAL | Status: DC

## 2012-08-08 MED ORDER — GLUCOSE BLOOD VI STRP: ORAL_STRIP | Status: DC

## 2012-08-08 MED ORDER — OLMESARTAN-AMLODIPINE-HCTZ 20-5-12.5 MG PO TABS: 1.0000 | ORAL_TABLET | Freq: Every day | ORAL | Status: DC

## 2012-08-08 MED ORDER — METFORMIN HCL 850 MG PO TABS: 850.0000 mg | ORAL_TABLET | Freq: Two times a day (BID) | ORAL | Status: DC

## 2012-08-08 MED ORDER — VALACYCLOVIR HCL 1 G PO TABS: 1000.0000 mg | ORAL_TABLET | Freq: Two times a day (BID) | ORAL | Status: DC

## 2012-08-08 NOTE — Progress Notes (Signed)
  Subjective:    Patient ID: Jacob Swanson, male    DOB: May 17, 1951, 61 y.o.   MRN: [id number]  HPI He is here for complete examination. He does have underlying diabetes and is taking excellent care of himself in that regard. He does check his sugars regularly. He checks his feet and gets yearly eye exams. Smoking and drinking were reviewed. He is sexually active and does use condoms. He does have underlying sleep apnea and is doing quite well with the CPAP machine. Work continues to go well.   Review of Systems  Constitutional: Negative.   HENT: Negative.   Eyes: Negative.   Respiratory: Negative.   Cardiovascular: Negative.   Gastrointestinal: Negative.   Genitourinary: Negative.   Musculoskeletal: Negative.   Skin: Negative.   Neurological: Negative.   Hematological: Negative.   Psychiatric/Behavioral: Negative.        Objective:   Physical Exam        Assessment & Plan:   1. Routine general medical examination at a health care facility    2. Diabetes mellitus  POCT glycosylated hemoglobin (Hb A1C), POCT UA - Microalbumin, Lipid panel, CBC with Differential, Comprehensive metabolic panel, POCT UA - Microalbumin  3. Hypertension associated with diabetes  POCT Urinalysis Dipstick, CBC with Differential, Comprehensive metabolic panel  4. Need for prophylactic vaccination and inoculation against influenza  Flu vaccine greater than or equal to 3yo preservative free IM, Tdap vaccine greater than or equal to 7yo IM  5. Hyperlipidemia LDL goal < 70  Lipid panel  6. Sleep apnea    7. Encounter for long-term (current) use of other medications  Lipid panel, CBC with Differential, Comprehensive metabolic panel   we will get a CPAP readout. Encouraged him to continue to take good care of himself.

## 2012-08-08 NOTE — Progress Notes (Signed)
PT HAS HAD TODAY UA,A1C,MICRO AND FLU SHOT,TDap

## 2012-08-09 NOTE — Progress Notes (Signed)
Quick Note:  Labs are unchanged. Continue on present medication regimen. Make sure he is scheduled for return visit in about 4 months ______

## 2012-08-09 NOTE — Progress Notes (Signed)
Quick Note:  PT HAS APT 4 MTH FROM NOW CALLED PT HOME LEFT MESSAGE ON HOME # LABS ARE UNCHANGED CONTINUE ON PRESENT MEDS AND WE WILL SEE HIM IN 4 MTH ______

## 2012-08-13 ENCOUNTER — Other Ambulatory Visit: Payer: Self-pay | Admitting: Family Medicine

## 2012-08-14 NOTE — Telephone Encounter (Signed)
Is this ok?

## 2012-08-17 ENCOUNTER — Telehealth: Payer: Self-pay

## 2012-08-17 NOTE — Telephone Encounter (Signed)
[employer] CALLED AND SAID Jacob Swanson'S MACHINE IS NOT DOWNLOADABLE

## 2012-08-17 NOTE — Telephone Encounter (Signed)
Work with him to get a machine that we can actually get good readings from. Check with [employer] to see  if they can help

## 2012-08-24 NOTE — Telephone Encounter (Signed)
CALLED [employer]  JESSICA SAID SHE TALKED TO SABRINA. Jessica SAID SHE TALKED WITH MR.Samara AND GAVE HIM 2 OPTIONS OF RENTING A MACHINE TO GET A DOWNLOAD OR BUYING NEW MACHINE PT SAID HE DID NOT WANT TO DO EITHER

## 2012-08-24 NOTE — Telephone Encounter (Signed)
Call a different company and see if they can help [place]

## 2012-09-05 NOTE — Telephone Encounter (Signed)
EVERYONE I HAVE CALLED ALL SAY HE WILL HAVE TO HAVE A NEW MACHINE IN ORDER TO GET READING AND NO ONE WILL LOAN ONE OUT HE WILL HAVE TO PAY FOR IT AND PT DOESN'T WANT TO DO THAT

## 2012-12-05 ENCOUNTER — Encounter: Payer: Self-pay | Admitting: Family Medicine

## 2012-12-05 ENCOUNTER — Ambulatory Visit (INDEPENDENT_AMBULATORY_CARE_PROVIDER_SITE_OTHER): Payer: BC Managed Care – PPO | Admitting: Family Medicine

## 2012-12-05 VITALS — BP 128/80 | HR 70 | Wt 203.0 lb

## 2012-12-05 DIAGNOSIS — I152 Hypertension secondary to endocrine disorders: Secondary | ICD-10-CM

## 2012-12-05 DIAGNOSIS — I10 Essential (primary) hypertension: Secondary | ICD-10-CM

## 2012-12-05 DIAGNOSIS — E1169 Type 2 diabetes mellitus with other specified complication: Secondary | ICD-10-CM

## 2012-12-05 DIAGNOSIS — E119 Type 2 diabetes mellitus without complications: Secondary | ICD-10-CM

## 2012-12-05 DIAGNOSIS — E785 Hyperlipidemia, unspecified: Secondary | ICD-10-CM

## 2012-12-05 DIAGNOSIS — E1159 Type 2 diabetes mellitus with other circulatory complications: Secondary | ICD-10-CM

## 2012-12-05 LAB — POCT GLYCOSYLATED HEMOGLOBIN (HGB A1C): Hemoglobin A1C: 7.8

## 2012-12-05 NOTE — Progress Notes (Signed)
Last eye exam :12/05/12 Dr.Thomas Brewington Last foot exam :08/08/12 Dr.Lalonde Test 2 x a day  B/S run 122- 130 Last A1C 08/08/12 6.8 Last micro-albu 08/08/12

## 2012-12-05 NOTE — Progress Notes (Signed)
Subjective:    Jacob Swanson is a 62 y.o. male who presents for follow-up of Type 2 diabetes mellitus.    Home blood sugar records: fasting range: 120 and in the 120 to 130 range before dinner  Current symptoms/problems include none and have   been unchanged. Daily foot checks, foot concerns:  Last eye exam:     Medication compliance: Current diet: in general, a "healthy" diet   Current exercise: aerobics and weightlifting Known diabetic complications: none Cardiovascular risk factors: advanced age (older than 69 for men, 34 for women), diabetes mellitus, dyslipidemia, hypertension and male gender   The following portions of the patient's history were reviewed and updated as appropriate: allergies, current medications, past family history, past medical history, past social history, past surgical history and problem list.  ROS as in subjective above    Objective:    BP 128/80  Pulse 70  Wt 203 lb (92.08 kg)  BMI 29.96 kg/m2  Filed Vitals:   12/05/12 0844  BP: 128/80  Pulse: 70    General appearence: alert, no distress, WD/WN Neck: supple, no lymphadenopathy, no thyromegaly, no masses Heart: RRR, normal S1, S2, no murmurs Lungs: CTA bilaterally, no wheezes, rhonchi, or rales Abdomen: +bs, soft, non tender, non distended, no masses, no hepatomegaly, no splenomegaly Pulses: 2+ symmetric, upper and lower extremities, normal cap refill Ext: no edema Foot exam:  Neuro: foot monofilament exam normal   Lab Review Lab Results  Component Value Date   HGBA1C 7.8 12/05/2012   Lab Results  Component Value Date   CHOL 214* 08/08/2012   HDL 50 08/08/2012   LDLCALC 151* 08/08/2012   TRIG 67 08/08/2012   CHOLHDL 4.3 08/08/2012   No results found for this basenameConcepcion Swanson     Chemistry      Component Value Date/Time   NA 140 08/08/2012 1055   K 4.0 08/08/2012 1055   CL 100 08/08/2012 1055   CO2 25 08/08/2012 1055   BUN 15 08/08/2012 1055   CREATININE  1.07 08/08/2012 1055   CREATININE 1.2 06/02/2006 1220      Component Value Date/Time   CALCIUM 9.7 08/08/2012 1055   ALKPHOS 70 08/08/2012 1055   AST 21 08/08/2012 1055   ALT 21 08/08/2012 1055   BILITOT 0.7 08/08/2012 1055        Chemistry      Component Value Date/Time   NA 140 08/08/2012 1055   K 4.0 08/08/2012 1055   CL 100 08/08/2012 1055   CO2 25 08/08/2012 1055   BUN 15 08/08/2012 1055   CREATININE 1.07 08/08/2012 1055   CREATININE 1.2 06/02/2006 1220      Component Value Date/Time   CALCIUM 9.7 08/08/2012 1055   ALKPHOS 70 08/08/2012 1055   AST 21 08/08/2012 1055   ALT 21 08/08/2012 1055   BILITOT 0.7 08/08/2012 1055    Hb A1c 7.8   Last optometry/ophthalmology exam reviewed from:    Assessment:   Encounter Diagnoses  Name Primary?  . Diabetes mellitus Yes  . Hyperlipidemia LDL goal < 70   . Hypertension associated with diabetes          Plan:    1.  Rx changes: none 2.  Education: Reviewed 'ABCs' of diabetes management (respective goals in parentheses):  A1C (<7), blood pressure (<130/80), and cholesterol (LDL <100). 3.  Compliance at present is estimated to be excellent. Efforts to improve compliance (if necessary) will be directed at regular blood sugar monitoring: daily. 4.  Follow up: 4 months  I discussed placing him on to quit his own however he would like to wait another 4 months and see if he can get the numbers down. Did strongly encourage him to check it 2 hours after meals.

## 2012-12-05 NOTE — Patient Instructions (Signed)
Check 2 hours after some of your meals and see what your blood sugars are running

## 2012-12-07 ENCOUNTER — Ambulatory Visit: Payer: BC Managed Care – PPO | Admitting: Family Medicine

## 2013-04-09 ENCOUNTER — Ambulatory Visit (INDEPENDENT_AMBULATORY_CARE_PROVIDER_SITE_OTHER): Payer: BC Managed Care – PPO | Admitting: Family Medicine

## 2013-04-09 ENCOUNTER — Encounter: Payer: Self-pay | Admitting: Family Medicine

## 2013-04-09 VITALS — BP 130/80 | HR 70 | Resp 16 | Wt 195.0 lb

## 2013-04-09 DIAGNOSIS — N529 Male erectile dysfunction, unspecified: Secondary | ICD-10-CM

## 2013-04-09 DIAGNOSIS — G4730 Sleep apnea, unspecified: Secondary | ICD-10-CM

## 2013-04-09 DIAGNOSIS — I10 Essential (primary) hypertension: Secondary | ICD-10-CM

## 2013-04-09 DIAGNOSIS — I152 Hypertension secondary to endocrine disorders: Secondary | ICD-10-CM

## 2013-04-09 DIAGNOSIS — E1169 Type 2 diabetes mellitus with other specified complication: Secondary | ICD-10-CM

## 2013-04-09 DIAGNOSIS — E785 Hyperlipidemia, unspecified: Secondary | ICD-10-CM

## 2013-04-09 DIAGNOSIS — E1159 Type 2 diabetes mellitus with other circulatory complications: Secondary | ICD-10-CM

## 2013-04-09 DIAGNOSIS — E119 Type 2 diabetes mellitus without complications: Secondary | ICD-10-CM

## 2013-04-09 NOTE — Patient Instructions (Signed)
Keep up the good work

## 2013-04-09 NOTE — Progress Notes (Signed)
Subjective:    Jacob Swanson is a 62 y.o. male who presents for follow-up of Type 2 diabetes mellitus.    Home blood sugar records: 120- to 135  Current symptoms/problems include none and have  been stable. Daily foot checks: yes   Any foot concerns: none Last eye exam:     Medication compliance: Current diet: well balanced Current exercise: walking Known diabetic complications: none Cardiovascular risk factors: advanced age (older than 41 for men, 4 for women), diabetes mellitus and hypertension He also has underlying sleep apnea is doing quite well on his CPAP. He continues to use Viagra on an as-needed basis.  The following portions of the patient's history were reviewed and updated as appropriate: allergies, current medications, past family history, past medical history, past social history and problem list.  ROS as in subjective above    Objective:    BP 130/80  Pulse 70  Resp 16  Wt 195 lb (88.451 kg)  BMI 28.78 kg/m2  Filed Vitals:   04/09/13 0852  BP: 130/80  Pulse: 70  Resp: 16    General appearence: alert, no distress, WD/WN Neck: supple, no lymphadenopathy, no thyromegaly, no masses Heart: RRR, normal S1, S2, no murmurs Lungs: CTA bilaterally, no wheezes, rhonchi, or rales Abdomen: +bs, soft, non tender, non distended, no masses, no hepatomegaly, no splenomegaly Pulses: 2+ symmetric, upper and lower extremities, normal cap refill Ext: no edema Foot exam:  Neuro: foot monofilament exam normal   Lab Review Lab Results  Component Value Date   HGBA1C 7.8 12/05/2012   Lab Results  Component Value Date   CHOL 214* 08/08/2012   HDL 50 08/08/2012   LDLCALC 151* 08/08/2012   TRIG 67 08/08/2012   CHOLHDL 4.3 08/08/2012   No results found for this basenameConcepcion Elk     Chemistry      Component Value Date/Time   NA 140 08/08/2012 1055   K 4.0 08/08/2012 1055   CL 100 08/08/2012 1055   CO2 25 08/08/2012 1055   BUN 15 08/08/2012 1055   CREATININE 1.07 08/08/2012 1055   CREATININE 1.2 06/02/2006 1220      Component Value Date/Time   CALCIUM 9.7 08/08/2012 1055   ALKPHOS 70 08/08/2012 1055   AST 21 08/08/2012 1055   ALT 21 08/08/2012 1055   BILITOT 0.7 08/08/2012 1055        Chemistry      Component Value Date/Time   NA 140 08/08/2012 1055   K 4.0 08/08/2012 1055   CL 100 08/08/2012 1055   CO2 25 08/08/2012 1055   BUN 15 08/08/2012 1055   CREATININE 1.07 08/08/2012 1055   CREATININE 1.2 06/02/2006 1220      Component Value Date/Time   CALCIUM 9.7 08/08/2012 1055   ALKPHOS 70 08/08/2012 1055   AST 21 08/08/2012 1055   ALT 21 08/08/2012 1055   BILITOT 0.7 08/08/2012 1055     Hemoglobin A1c is 6.3    Assessment:   Encounter Diagnoses  Name Primary?  . Sleep apnea Yes  . Hypertension associated with diabetes   . Hyperlipidemia LDL goal < 70   . Diabetes mellitus   . ED (erectile dysfunction)          Plan:    1.  Rx changes: none 2.  Education: Reviewed 'ABCs' of diabetes management (respective goals in parentheses):  A1C (<7), blood pressure (<130/80), and cholesterol (LDL <100). 3.  Compliance at present is estimated to be excellent. Efforts to improve  compliance (if necessary) will be directed at none. 4. Follow up: 4 months   I encouraged him to continue to take excellent care of himself.

## 2013-08-10 ENCOUNTER — Other Ambulatory Visit: Payer: Self-pay | Admitting: Family Medicine

## 2013-08-10 ENCOUNTER — Encounter: Payer: Self-pay | Admitting: Family Medicine

## 2013-08-10 ENCOUNTER — Ambulatory Visit (INDEPENDENT_AMBULATORY_CARE_PROVIDER_SITE_OTHER): Payer: BC Managed Care – PPO | Admitting: Family Medicine

## 2013-08-10 VITALS — BP 124/80 | HR 88 | Ht 69.0 in | Wt 199.0 lb

## 2013-08-10 DIAGNOSIS — E785 Hyperlipidemia, unspecified: Secondary | ICD-10-CM

## 2013-08-10 DIAGNOSIS — E1159 Type 2 diabetes mellitus with other circulatory complications: Secondary | ICD-10-CM

## 2013-08-10 DIAGNOSIS — E1169 Type 2 diabetes mellitus with other specified complication: Secondary | ICD-10-CM

## 2013-08-10 DIAGNOSIS — G4730 Sleep apnea, unspecified: Secondary | ICD-10-CM

## 2013-08-10 DIAGNOSIS — N529 Male erectile dysfunction, unspecified: Secondary | ICD-10-CM

## 2013-08-10 DIAGNOSIS — I10 Essential (primary) hypertension: Secondary | ICD-10-CM

## 2013-08-10 DIAGNOSIS — E119 Type 2 diabetes mellitus without complications: Secondary | ICD-10-CM

## 2013-08-10 DIAGNOSIS — Z79899 Other long term (current) drug therapy: Secondary | ICD-10-CM

## 2013-08-10 DIAGNOSIS — I152 Hypertension secondary to endocrine disorders: Secondary | ICD-10-CM

## 2013-08-10 DIAGNOSIS — Z23 Encounter for immunization: Secondary | ICD-10-CM

## 2013-08-10 LAB — COMPREHENSIVE METABOLIC PANEL
AST: 16 U/L (ref 0–37)
Albumin: 4.6 g/dL (ref 3.5–5.2)
Alkaline Phosphatase: 82 U/L (ref 39–117)
BUN: 15 mg/dL (ref 6–23)
CO2: 27 mEq/L (ref 19–32)
Creat: 1.12 mg/dL (ref 0.50–1.35)
Glucose, Bld: 140 mg/dL — ABNORMAL HIGH (ref 70–99)
Sodium: 138 mEq/L (ref 135–145)
Total Bilirubin: 0.5 mg/dL (ref 0.3–1.2)
Total Protein: 7.2 g/dL (ref 6.0–8.3)

## 2013-08-10 LAB — CBC WITH DIFFERENTIAL/PLATELET
Basophils Absolute: 0 10*3/uL (ref 0.0–0.1)
Basophils Relative: 0 % (ref 0–1)
Eosinophils Absolute: 0.2 10*3/uL (ref 0.0–0.7)
Eosinophils Relative: 3 % (ref 0–5)
HCT: 39.8 % (ref 39.0–52.0)
Hemoglobin: 13.7 g/dL (ref 13.0–17.0)
Lymphs Abs: 2.7 10*3/uL (ref 0.7–4.0)
MCH: 30.5 pg (ref 26.0–34.0)
MCHC: 34.4 g/dL (ref 30.0–36.0)
MCV: 88.6 fL (ref 78.0–100.0)
Monocytes Absolute: 0.3 10*3/uL (ref 0.1–1.0)
Monocytes Relative: 5 % (ref 3–12)
Neutro Abs: 3.4 10*3/uL (ref 1.7–7.7)
Neutrophils Relative %: 51 % (ref 43–77)
RBC: 4.49 MIL/uL (ref 4.22–5.81)
WBC: 6.6 10*3/uL (ref 4.0–10.5)

## 2013-08-10 LAB — POCT UA - MICROALBUMIN
Creatinine, POC: 353.5 mg/dL
Microalbumin Ur, POC: 19.2 mg/L

## 2013-08-10 LAB — LIPID PANEL
Cholesterol: 232 mg/dL — ABNORMAL HIGH (ref 0–200)
HDL: 51 mg/dL (ref >39)
LDL Cholesterol: 165 mg/dL — ABNORMAL HIGH (ref 0–99)
Total CHOL/HDL Ratio: 4.5 Ratio
Triglycerides: 79 mg/dL (ref <150)
VLDL: 16 mg/dL (ref 0–40)

## 2013-08-10 MED ORDER — OLMESARTAN-AMLODIPINE-HCTZ 20-5-12.5 MG PO TABS: 1.0000 | ORAL_TABLET | Freq: Every day | ORAL | Status: DC

## 2013-08-10 MED ORDER — ROSUVASTATIN CALCIUM 40 MG PO TABS: 40.0000 mg | ORAL_TABLET | Freq: Every day | ORAL | Status: DC

## 2013-08-10 MED ORDER — METFORMIN HCL 850 MG PO TABS: 850.0000 mg | ORAL_TABLET | Freq: Two times a day (BID) | ORAL | Status: DC

## 2013-08-10 NOTE — Progress Notes (Signed)
Subjective:    Jacob Swanson is a 62 y.o. male who presents for follow-up of Type 2 diabetes mellitus.  He also is had difficulty with left upper quadrant discomfort that has been intermittent in nature. He cannot relate this to breathing, eating, urinary symptoms. He sometimes does get worse with motion. He's had no fever, chills, sore throat. He also complains of intermittent right-sided headache. No blurred or double vision. No relation to his blood sugars. He is also noted difficulty with erectile dysfunction stating the Viagra is not as effective to get and maintain an erection. He is also had some difficulty with ejaculation. He also has underlying sleep apnea and has not had a read out in approximately 9 months. Home blood sugar records: AVG 120'S TEST BID  Current symptoms/problems HEADACHE PAIN IN FEET AND LLQ PAIN ALL ON AND OFF Daily foot checks: Any foot concerns: NO Last eye exam:  8/14    Medication compliance: Good Current diet: EATS A LOT OF VEGIES Current exercise: GYM 3 TO 4 X A WEEK Known diabetic complications: impotence Cardiovascular risk factors: advanced age (older than 76 for men, 43 for women), diabetes mellitus, dyslipidemia, family history of premature cardiovascular disease and male gender   The following portions of the patient's history were reviewed and updated as appropriate: allergies, current medications, past family history, past medical history, past social history and problem list.  ROS as in subjective above    Objective:   General appearence: alert, no distress, WD/WN Neck: supple, no lymphadenopathy, no thyromegaly, no masses Heart: RRR, normal S1, S2, no murmurs Lungs: CTA bilaterally, no wheezes, rhonchi, or rales Abdomen: +bs, soft, non tender, non distended, no masses, no hepatomegaly, no splenomegaly Pulses: 2+ symmetric, upper and lower extremities, normal cap refill Lab Review Lab Results  Component Value Date   HGBA1C 6.3 04/09/2013    Lab Results  Component Value Date   CHOL 214* 08/08/2012   HDL 50 08/08/2012   LDLCALC 151* 08/08/2012   TRIG 67 08/08/2012   CHOLHDL 4.3 08/08/2012   No results found for this basenameConcepcion Elk     Chemistry      Component Value Date/Time   NA 140 08/08/2012 1055   K 4.0 08/08/2012 1055   CL 100 08/08/2012 1055   CO2 25 08/08/2012 1055   BUN 15 08/08/2012 1055   CREATININE 1.07 08/08/2012 1055   CREATININE 1.2 06/02/2006 1220      Component Value Date/Time   CALCIUM 9.7 08/08/2012 1055   ALKPHOS 70 08/08/2012 1055   AST 21 08/08/2012 1055   ALT 21 08/08/2012 1055   BILITOT 0.7 08/08/2012 1055        Chemistry      Component Value Date/Time   NA 140 08/08/2012 1055   K 4.0 08/08/2012 1055   CL 100 08/08/2012 1055   CO2 25 08/08/2012 1055   BUN 15 08/08/2012 1055   CREATININE 1.07 08/08/2012 1055   CREATININE 1.2 06/02/2006 1220      Component Value Date/Time   CALCIUM 9.7 08/08/2012 1055   ALKPHOS 70 08/08/2012 1055   AST 21 08/08/2012 1055   ALT 21 08/08/2012 1055   BILITOT 0.7 08/08/2012 1055       Hemoglobin A1c is 7.0  Assessment:   Encounter Diagnoses  Name Primary?  . Diabetes mellitus Yes  . ED (erectile dysfunction)   . Hypertension associated with diabetes   . Hyperlipidemia LDL goal < 70   . Sleep apnea   .  Need for prophylactic vaccination and inoculation against influenza   . Encounter for long-term (current) use of other medications          Plan:    1.  Rx changes: I will switch him to Cialis to see if this will help with his erectile problem. 2.  Education: Reviewed 'ABCs' of diabetes management (respective goals in parentheses):  A1C (<7), blood pressure (<130/80), and cholesterol (LDL <100). 3.  Compliance at present is estimated to be good. Efforts to improve compliance (if necessary) will be directed at No change. 4. Follow up: 4 months  If he has further trouble with erectile dysfunction and ejaculation,  I will refer to urology. Discusses slight increase in hemoglobin A1c and reassured him that he was not in any danger. The left upper quadrant pain and headache were discussed and at this time we will monitor the situation. We will also get a CPAP readout.

## 2013-08-10 NOTE — Telephone Encounter (Signed)
Patient needs on valtrex, viagra, tribenzor, crestor and metformin   His Rx's expired on 12/17  And he is scheduled for CPE and 4 mo reck in April

## 2013-08-21 ENCOUNTER — Other Ambulatory Visit: Payer: Self-pay | Admitting: Family Medicine

## 2013-08-21 NOTE — Telephone Encounter (Signed)
I KNOW DIABETES SUPPLY OKAY HOW ABOUT THE VIAGRA

## 2013-12-05 LAB — HM DIABETES EYE EXAM

## 2013-12-06 ENCOUNTER — Encounter: Payer: BC Managed Care – PPO | Admitting: Family Medicine

## 2013-12-17 ENCOUNTER — Encounter: Payer: Self-pay | Admitting: Family Medicine

## 2014-09-10 ENCOUNTER — Ambulatory Visit (INDEPENDENT_AMBULATORY_CARE_PROVIDER_SITE_OTHER): Payer: BC Managed Care – PPO | Admitting: Family Medicine

## 2014-09-10 ENCOUNTER — Encounter: Payer: Self-pay | Admitting: Family Medicine

## 2014-09-10 VITALS — BP 122/80 | HR 74 | Ht 69.0 in | Wt 205.0 lb

## 2014-09-10 DIAGNOSIS — Z0000 Encounter for general adult medical examination without abnormal findings: Secondary | ICD-10-CM

## 2014-09-10 DIAGNOSIS — E785 Hyperlipidemia, unspecified: Secondary | ICD-10-CM

## 2014-09-10 DIAGNOSIS — G4730 Sleep apnea, unspecified: Secondary | ICD-10-CM

## 2014-09-10 DIAGNOSIS — I152 Hypertension secondary to endocrine disorders: Secondary | ICD-10-CM

## 2014-09-10 DIAGNOSIS — E1159 Type 2 diabetes mellitus with other circulatory complications: Secondary | ICD-10-CM

## 2014-09-10 DIAGNOSIS — E1169 Type 2 diabetes mellitus with other specified complication: Secondary | ICD-10-CM

## 2014-09-10 DIAGNOSIS — Z23 Encounter for immunization: Secondary | ICD-10-CM

## 2014-09-10 DIAGNOSIS — I10 Essential (primary) hypertension: Secondary | ICD-10-CM

## 2014-09-10 DIAGNOSIS — N5201 Erectile dysfunction due to arterial insufficiency: Secondary | ICD-10-CM

## 2014-09-10 DIAGNOSIS — E119 Type 2 diabetes mellitus without complications: Secondary | ICD-10-CM

## 2014-09-10 LAB — CBC WITH DIFFERENTIAL/PLATELET
Basophils Absolute: 0 10*3/uL (ref 0.0–0.1)
Basophils Relative: 0 % (ref 0–1)
EOS ABS: 0.2 10*3/uL (ref 0.0–0.7)
EOS PCT: 3 % (ref 0–5)
HCT: 41.3 % (ref 39.0–52.0)
HEMOGLOBIN: 13.9 g/dL (ref 13.0–17.0)
LYMPHS PCT: 40 % (ref 12–46)
Lymphs Abs: 2.8 10*3/uL (ref 0.7–4.0)
MCH: 30.7 pg (ref 26.0–34.0)
MCHC: 33.7 g/dL (ref 30.0–36.0)
MCV: 91.2 fL (ref 78.0–100.0)
MPV: 9.1 fL (ref 8.6–12.4)
Monocytes Absolute: 0.4 10*3/uL (ref 0.1–1.0)
Monocytes Relative: 6 % (ref 3–12)
Neutro Abs: 3.6 10*3/uL (ref 1.7–7.7)
Neutrophils Relative %: 51 % (ref 43–77)
Platelets: 266 10*3/uL (ref 150–400)
RBC: 4.53 MIL/uL (ref 4.22–5.81)
RDW: 14.2 % (ref 11.5–15.5)
WBC: 7.1 10*3/uL (ref 4.0–10.5)

## 2014-09-10 LAB — LIPID PANEL
Cholesterol: 210 mg/dL — ABNORMAL HIGH (ref 0–200)
HDL: 49 mg/dL (ref >39)
LDL Cholesterol: 144 mg/dL — ABNORMAL HIGH (ref 0–99)
Total CHOL/HDL Ratio: 4.3 Ratio
Triglycerides: 87 mg/dL (ref <150)
VLDL: 17 mg/dL (ref 0–40)

## 2014-09-10 LAB — COMPREHENSIVE METABOLIC PANEL
ALBUMIN: 4.2 g/dL (ref 3.5–5.2)
ALT: 19 U/L (ref 0–53)
AST: 20 U/L (ref 0–37)
Alkaline Phosphatase: 82 U/L (ref 39–117)
BILIRUBIN TOTAL: 0.5 mg/dL (ref 0.2–1.2)
BUN: 17 mg/dL (ref 6–23)
CO2: 25 meq/L (ref 19–32)
Calcium: 9.9 mg/dL (ref 8.4–10.5)
Chloride: 98 mEq/L (ref 96–112)
Creat: 1.22 mg/dL (ref 0.50–1.35)
Glucose, Bld: 160 mg/dL — ABNORMAL HIGH (ref 70–99)
POTASSIUM: 4.5 meq/L (ref 3.5–5.3)
SODIUM: 133 meq/L — AB (ref 135–145)
TOTAL PROTEIN: 7.6 g/dL (ref 6.0–8.3)

## 2014-09-10 LAB — HEMOGLOBIN A1C
Hgb A1c MFr Bld: 8.5 % — ABNORMAL HIGH (ref <5.7)
Mean Plasma Glucose: 197 mg/dL — ABNORMAL HIGH (ref <117)

## 2014-09-10 MED ORDER — GLUCOSE BLOOD VI STRP: ORAL_STRIP | Status: DC

## 2014-09-10 MED ORDER — METFORMIN HCL 850 MG PO TABS: 850.0000 mg | ORAL_TABLET | Freq: Two times a day (BID) | ORAL | Status: DC

## 2014-09-10 MED ORDER — ROSUVASTATIN CALCIUM 40 MG PO TABS: 40.0000 mg | ORAL_TABLET | Freq: Every day | ORAL | Status: DC

## 2014-09-10 MED ORDER — SILDENAFIL CITRATE 100 MG PO TABS: 100.0000 mg | ORAL_TABLET | Freq: Every day | ORAL | Status: DC | PRN

## 2014-09-10 MED ORDER — OLMESARTAN-AMLODIPINE-HCTZ 20-5-12.5 MG PO TABS: 1.0000 | ORAL_TABLET | Freq: Every day | ORAL | Status: DC

## 2014-09-10 NOTE — Progress Notes (Signed)
Subjective:    Patient ID: Jacob Swanson, male    DOB: 01/07/1951, 64 y.o.   MRN: [id number]  HPI He is here for complete examination. He has not followed up appropriately on his diabetes. He skipped his last appointment due to work and then did not schedule a follow-up appointment. He does continue on medications listed in the chart. They were reviewed with him. He does periodically check his blood sugar. He will set up an eye exam for in the near future. He does check his feet regularly. Does not smoke and exercises regularly. He does have underlying ED and is getting good results with his present medication regimen. His work is going well. He is considering retiring in a couple of years. He continues to use his CPAP but has not had a read out in quite some time.   Review of Systems     Objective:   Physical Exam BP 122/80 mmHg  Pulse 74  Ht 5\' 9"  (1.753 m)  Wt 205 lb (92.987 kg)  BMI 30.26 kg/m2  SpO2 98%  General Appearance:    Alert, cooperative, no distress, appears stated age  Head:    Normocephalic, without obvious abnormality, atraumatic  Eyes:    PERRL, conjunctiva/corneas clear, EOM's intact, fundi    benign  Ears:    Normal TM's and external ear canals  Nose:   Nares normal, mucosa normal, no drainage or sinus   tenderness  Throat:   Lips, mucosa, and tongue normal; teeth and gums normal  Neck:   Supple, no lymphadenopathy;  thyroid:  no   enlargement/tenderness/nodules; no carotid   bruit or JVD  Back:    Spine nontender, no curvature, ROM normal, no CVA     tenderness  Lungs:     Clear to auscultation bilaterally without wheezes, rales or     ronchi; respirations unlabored  Chest Wall:    No tenderness or deformity   Heart:    Regular rate and rhythm, S1 and S2 normal, no murmur, rub   or gallop  Breast Exam:    No chest wall tenderness, masses or gynecomastia  Abdomen:     Soft, non-tender, nondistended, normoactive bowel sounds,    no masses, no hepatosplenomegaly    Genitalia:    Normal male external genitalia without lesions.  Testicles without masses.  No inguinal hernias.  Rectal:    Normal sphincter tone, no masses or tenderness; guaiac negative stool.  Prostate smooth, no nodules, not enlarged.  Extremities:   No clubbing, cyanosis or edema  Pulses:   2+ and symmetric all extremities  Skin:   Skin color, texture, turgor normal, no rashes or lesions  Lymph nodes:   Cervical, supraclavicular, and axillary nodes normal  Neurologic:   CNII-XII intact, normal strength, sensation and gait; reflexes 2+ and symmetric throughout          Psych:   Normal mood, affect, hygiene and grooming.          Assessment & Plan:  Routine general medical examination at a health care facility - Plan: CBC with Differential, Comprehensive metabolic panel, Lipid panel  Type 2 diabetes mellitus without complication - Plan: [facility] Diabetes Foot Exam, CBC with Differential, Comprehensive metabolic panel, Lipid panel, Hemoglobin A1c, CANCELED: POCT UA - Microalbumin  Need for prophylactic vaccination against Streptococcus pneumoniae (pneumococcus) - Plan: Pneumococcal conjugate vaccine 13-valent  Erectile dysfunction due to arterial insufficiency  Hyperlipidemia associated with type 2 diabetes mellitus - Plan: Lipid panel  Hypertension associated  with diabetes  Sleep apnea

## 2014-09-10 NOTE — Patient Instructions (Signed)
Check your sugars either before a meal or 2 hours after a meal 

## 2014-11-06 LAB — HM DIABETES EYE EXAM

## 2014-12-10 ENCOUNTER — Encounter: Payer: Self-pay | Admitting: Family Medicine

## 2014-12-10 ENCOUNTER — Ambulatory Visit (INDEPENDENT_AMBULATORY_CARE_PROVIDER_SITE_OTHER): Payer: BC Managed Care – PPO | Admitting: Family Medicine

## 2014-12-10 VITALS — BP 126/80 | HR 86 | Ht 69.0 in | Wt 199.0 lb

## 2014-12-10 DIAGNOSIS — N528 Other male erectile dysfunction: Secondary | ICD-10-CM

## 2014-12-10 DIAGNOSIS — E1169 Type 2 diabetes mellitus with other specified complication: Secondary | ICD-10-CM

## 2014-12-10 DIAGNOSIS — E1159 Type 2 diabetes mellitus with other circulatory complications: Secondary | ICD-10-CM

## 2014-12-10 DIAGNOSIS — I152 Hypertension secondary to endocrine disorders: Secondary | ICD-10-CM

## 2014-12-10 DIAGNOSIS — E119 Type 2 diabetes mellitus without complications: Secondary | ICD-10-CM | POA: Diagnosis not present

## 2014-12-10 DIAGNOSIS — G4730 Sleep apnea, unspecified: Secondary | ICD-10-CM | POA: Diagnosis not present

## 2014-12-10 DIAGNOSIS — I10 Essential (primary) hypertension: Secondary | ICD-10-CM | POA: Diagnosis not present

## 2014-12-10 DIAGNOSIS — E785 Hyperlipidemia, unspecified: Secondary | ICD-10-CM | POA: Diagnosis not present

## 2014-12-10 LAB — POCT GLYCOSYLATED HEMOGLOBIN (HGB A1C): HEMOGLOBIN A1C: 7.5

## 2014-12-10 LAB — POCT UA - MICROALBUMIN
Albumin/Creatinine Ratio, Urine, POC: 2.8
CREATININE, POC: 401 mg/dL
Microalbumin Ur, POC: 11.4 mg/L

## 2014-12-10 NOTE — Progress Notes (Signed)
  Subjective:    Patient ID: Jacob Swanson, male    DOB: 04/28/1951, 64 y.o.   MRN: [id number]  Jacob Swanson is a 64 y.o. male who presents for follow-up of Type 2 diabetes mellitus.he also has sleep apnea but has not arranged to have a read out on it but does plan to do that. He continues on his ED medication and is having no difficulty with that. He plans on working another couple of years.  Home blood sugar records: Patient test BID Current symptoms/problems None Daily foot checks:   Any foot concerns: none Exercise: still goes to gym 3 times a week  EYE: 12/05/2013 he plans to schedule an appointment with his new ophthalmologist as his old one has retired. The following portions of the patient's history were reviewed and updated as appropriate: allergies, current medications, past medical history, past social history and problem list.  ROS as in subjective above.     Objective:    Physical Exam Alert and in no distress otherwise not examined.   Lab Review Diabetic Labs Latest Ref Rng 09/10/2014 08/10/2013 04/09/2013 12/05/2012 08/08/2012  HbA1c <5.7 % 8.5(H) 7.0 6.3 7.8 6.8  Chol 0 - 200 mg/dL 409(W210(H) 119(J232(H) - - 478(G214(H)  HDL >39 mg/dL 49 51 - - 50  Calc LDL 0 - 99 mg/dL 956(O144(H) 130(Q165(H) - - 657(Q151(H)  Triglycerides <150 mg/dL 87 79 - - 67  Creatinine 0.50 - 1.35 mg/dL 4.691.22 6.291.12 - - 5.281.07   BP/Weight 09/10/2014 08/10/2013 04/09/2013 12/05/2012 08/08/2012  Systolic BP 122 124 130 128 122  Diastolic BP 80 80 80 80 80  Wt. (Lbs) 205 199 195 203 202  BMI 30.26 29.37 28.78 29.96 29.82   Foot/eye exam completion dates Latest Ref Rng 12/05/2013 08/08/2012  Eye Exam No Retinopathy No Retinopathy -  Foot Form Completion - - Done    Jacob Swanson  reports that he has never smoked. He has never used smokeless tobacco. He reports that he drinks alcohol. He reports that he does not use illicit drugs. Hemoglobin A1c is 7.5    Assessment & Plan:    Hypertension associated with diabetes  Hyperlipidemia  associated with type 2 diabetes mellitus  Other male erectile dysfunction  Type 2 diabetes mellitus without complication - Plan: POCT glycosylated hemoglobin (Hb A1C), POCT UA - Microalbumin  Sleep apnea   1. Rx changes: none 2. Education: Reviewed 'ABCs' of diabetes management (respective goals in parentheses):  A1C (<7), blood pressure (<130/80), and cholesterol (LDL <100). 3. Compliance at present is estimated to be good. Efforts to improve compliance (if necessary) will be directed at he plans to do a better job with diet and exercise to try and get his A1c below 7 about adding more medication.. 4. Follow up: 4 months

## 2014-12-10 NOTE — Patient Instructions (Signed)
Check your blood sugars 2 hours after some your meals.

## 2014-12-25 ENCOUNTER — Ambulatory Visit: Payer: BC Managed Care – PPO | Admitting: Family Medicine

## 2015-01-08 ENCOUNTER — Ambulatory Visit (INDEPENDENT_AMBULATORY_CARE_PROVIDER_SITE_OTHER): Payer: BC Managed Care – PPO | Admitting: Family Medicine

## 2015-01-08 ENCOUNTER — Encounter: Payer: Self-pay | Admitting: Family Medicine

## 2015-01-08 VITALS — BP 148/90 | HR 70 | Wt 201.8 lb

## 2015-01-08 DIAGNOSIS — H9191 Unspecified hearing loss, right ear: Secondary | ICD-10-CM | POA: Diagnosis not present

## 2015-01-08 DIAGNOSIS — R202 Paresthesia of skin: Secondary | ICD-10-CM

## 2015-01-08 NOTE — Progress Notes (Signed)
   Subjective:    Patient ID: Cleon Dewlton Fisher, male    DOB: 02/25/1951, 64 y.o.   MRN: [id number]  HPI He complains of an intermittent tingling sensation in the right arm. He points to the middle of his palm as to where he is having difficulty. He cannot relate this to any wrist or elbow position. He's had no numbness, tingling or weakness. No neck pain. 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   Review of Systems     Objective:   Physical Exam Alert and in no distress. Full motion of the neck without pain. Normal motor, sensory and DTRs.       Assessment & Plan:  Tingling of right upper extremity  Hearing loss, right - Plan: Ambulatory referral to ENT Recommend he keep track of his sensory symptoms to see if there is any relation to wrist or elbow position or possibly even neck position. He will get back to me concerning this.

## 2015-01-08 NOTE — Patient Instructions (Signed)
The next time this occurs pain attention to the position of your wrist and elbows in particular. Also where you having the tingling sensation exactly

## 2015-04-07 ENCOUNTER — Telehealth: Payer: Self-pay

## 2015-04-07 MED ORDER — VALACYCLOVIR HCL 1 G PO TABS: 1000.0000 mg | ORAL_TABLET | Freq: Two times a day (BID) | ORAL | Status: DC

## 2015-04-07 NOTE — Telephone Encounter (Signed)
Received refill request for Valacyclovir 1 gram #30.

## 2015-04-14 ENCOUNTER — Encounter: Payer: Self-pay | Admitting: Family Medicine

## 2015-04-14 ENCOUNTER — Ambulatory Visit (INDEPENDENT_AMBULATORY_CARE_PROVIDER_SITE_OTHER): Payer: BC Managed Care – PPO | Admitting: Family Medicine

## 2015-04-14 VITALS — BP 130/84 | HR 70 | Ht 69.0 in | Wt 194.0 lb

## 2015-04-14 DIAGNOSIS — I152 Hypertension secondary to endocrine disorders: Secondary | ICD-10-CM

## 2015-04-14 DIAGNOSIS — N529 Male erectile dysfunction, unspecified: Secondary | ICD-10-CM | POA: Diagnosis not present

## 2015-04-14 DIAGNOSIS — G4730 Sleep apnea, unspecified: Secondary | ICD-10-CM | POA: Diagnosis not present

## 2015-04-14 DIAGNOSIS — I10 Essential (primary) hypertension: Secondary | ICD-10-CM | POA: Diagnosis not present

## 2015-04-14 DIAGNOSIS — E785 Hyperlipidemia, unspecified: Secondary | ICD-10-CM | POA: Diagnosis not present

## 2015-04-14 DIAGNOSIS — E1169 Type 2 diabetes mellitus with other specified complication: Secondary | ICD-10-CM | POA: Diagnosis not present

## 2015-04-14 DIAGNOSIS — Z23 Encounter for immunization: Secondary | ICD-10-CM | POA: Diagnosis not present

## 2015-04-14 DIAGNOSIS — E119 Type 2 diabetes mellitus without complications: Secondary | ICD-10-CM

## 2015-04-14 DIAGNOSIS — E1159 Type 2 diabetes mellitus with other circulatory complications: Secondary | ICD-10-CM

## 2015-04-14 LAB — POCT GLYCOSYLATED HEMOGLOBIN (HGB A1C): Hemoglobin A1C: 6.9

## 2015-04-14 MED ORDER — VARDENAFIL HCL 10 MG PO TABS: 10.0000 mg | ORAL_TABLET | Freq: Every day | ORAL | Status: DC | PRN

## 2015-04-14 NOTE — Progress Notes (Signed)
  Subjective:    Patient ID: Jacob Swanson, male    DOB: 08/11/1951, 64 y.o.   MRN: [id number]  Jacob Swanson is a 64 y.o. male who presents for follow-up of Type 2 diabetes mellitus.  Home blood sugar records: patient test BID Current symptoms/problems None Daily foot checks: yes   Any foot concerns: none Exercise: walking light weight lifting Eye: Dr.Whitiker 10/2014 He does have underlying GERD and would like to be switched to a different medication. He does state that Cialis works well compared to Viagra. He also continues to use his CPAP. The following portions of the patient's history were reviewed and updated as appropriate: allergies, current medications, past medical history, past social history and problem list.  ROS as in subjective above.     Objective:    Physical Exam Alert and in no distress otherwise not examined.   Lab Review Diabetic Labs Latest Ref Rng 12/10/2014 09/10/2014 08/10/2013 04/09/2013 12/05/2012  HbA1c - 7.5 8.5(H) 7.0 6.3 7.8  Chol 0 - 200 mg/dL - 782(N) 562(Z) - -  HDL >39 mg/dL - 49 51 - -  Calc LDL 0 - 99 mg/dL - 308(M) 578(I) - -  Triglycerides <150 mg/dL - 87 79 - -  Creatinine 0.50 - 1.35 mg/dL - 6.96 2.95 - -   BP/Weight 01/08/2015 12/10/2014 09/10/2014 08/10/2013 04/09/2013  Systolic BP 148 126 122 124 130  Diastolic BP 90 80 80 80 80  Wt. (Lbs) 201.8 199 205 199 195  BMI 29.79 29.37 30.26 29.37 28.78   Foot/eye exam completion dates Latest Ref Rng 12/05/2013 08/08/2012  Eye Exam No Retinopathy No Retinopathy -  Foot Form Completion - - Done  HbA1C 6.9   Herron  reports that he has never smoked. He has never used smokeless tobacco. He reports that he drinks alcohol. He reports that he does not use illicit drugs.     Assessment & Plan:    Type 2 diabetes mellitus without complication - Plan: POCT glycosylated hemoglobin (Hb A1C)  Hyperlipidemia associated with type 2 diabetes mellitus  Hypertension associated with diabetes  Sleep  apnea  Erectile dysfunction, unspecified erectile dysfunction type - Plan: vardenafil (LEVITRA) 10 MG tablet  Need for prophylactic vaccination and inoculation against influenza - Plan: Flu Vaccine QUAD 36+ mos IM   1. Rx changes: His insurance will cover Levitra. A prescription will be called in. 2. Education: Reviewed 'ABCs' of diabetes management (respective goals in parentheses):  A1C (<7), blood pressure (<130/80), and cholesterol (LDL <100). 3. Compliance at present is estimated to be excellent. Efforts to improve compliance (if necessary) will be directed at no change. 4. Follow up: 4 months  5. Flu shot was discussed with him. He has gotten them in the past with no difficulty. Risks and benefits were discussed. I encouraged him to continue to take good care of himself.

## 2015-07-18 ENCOUNTER — Other Ambulatory Visit: Payer: Self-pay | Admitting: Family Medicine

## 2015-08-08 ENCOUNTER — Encounter: Payer: Self-pay | Admitting: Family Medicine

## 2015-08-08 ENCOUNTER — Ambulatory Visit (INDEPENDENT_AMBULATORY_CARE_PROVIDER_SITE_OTHER): Payer: BC Managed Care – PPO | Admitting: Family Medicine

## 2015-08-08 VITALS — BP 124/78 | HR 64 | Wt 203.0 lb

## 2015-08-08 DIAGNOSIS — E118 Type 2 diabetes mellitus with unspecified complications: Secondary | ICD-10-CM | POA: Diagnosis not present

## 2015-08-08 DIAGNOSIS — E785 Hyperlipidemia, unspecified: Secondary | ICD-10-CM

## 2015-08-08 DIAGNOSIS — E1159 Type 2 diabetes mellitus with other circulatory complications: Secondary | ICD-10-CM | POA: Diagnosis not present

## 2015-08-08 DIAGNOSIS — E1169 Type 2 diabetes mellitus with other specified complication: Secondary | ICD-10-CM | POA: Diagnosis not present

## 2015-08-08 DIAGNOSIS — I10 Essential (primary) hypertension: Secondary | ICD-10-CM | POA: Diagnosis not present

## 2015-08-08 DIAGNOSIS — G4730 Sleep apnea, unspecified: Secondary | ICD-10-CM

## 2015-08-08 DIAGNOSIS — I152 Hypertension secondary to endocrine disorders: Secondary | ICD-10-CM

## 2015-08-08 LAB — POCT GLYCOSYLATED HEMOGLOBIN (HGB A1C)

## 2015-08-08 NOTE — Progress Notes (Signed)
  Subjective:    Patient ID: Jacob Swanson, male    DOB: 11/29/1950, 64 y.o.   MRN: [id number]  Jacob Swanson is a 64 y.o. male who presents for follow-up of Type 2 diabetes mellitus.  Home blood sugar records: fasting range: 125-150  How often patient checks blood sugars: twice a day. He admits to dietary indiscretion over the holidays. Current symptoms/problems include none and have been stable. Daily foot checks: daily  Any foot concerns: none Exercise: Gym/ health club routine includes walking on track .  he continues on his CPAP and is having no difficulty with that. The following portions of the patient's history were reviewed and updated as appropriate: allergies, current medications, past medical history, past social history and problem list.  ROS as in subjective above.     Objective:    Physical Exam Alert and in no distress otherwise not examined.  Lab Review Diabetic Labs Latest Ref Rng 08/08/2015 04/14/2015 12/10/2014 09/10/2014 08/10/2013  HbA1c - 7.6% 6.9 7.5 8.5(H) 7.0  Chol 0 - 200 mg/dL - - - 409(W210(H) 119(J232(H)  HDL >39 mg/dL - - - 49 51  Calc LDL 0 - 99 mg/dL - - - 478(G144(H) 956(O165(H)  Triglycerides <150 mg/dL - - - 87 79  Creatinine 0.50 - 1.35 mg/dL - - - 1.301.22 8.651.12   BP/Weight 08/08/2015 04/14/2015 01/08/2015 12/10/2014 09/10/2014  Systolic BP 124 130 148 126 122  Diastolic BP 78 84 90 80 80  Wt. (Lbs) 203 194 201.8 199 205  BMI 29.96 28.64 29.79 29.37 30.26   Foot/eye exam completion dates Latest Ref Rng 11/06/2014 12/05/2013  Eye Exam No Retinopathy No Retinopathy No Retinopathy  Foot Form Completion - - -   hemoglobin A1c is 7.6  Jacob Swanson  reports that he has never smoked. He has never used smokeless tobacco. He reports that he drinks alcohol. He reports that he does not use illicit drugs.     Assessment & Plan:    Sleep apnea  Hyperlipidemia associated with type 2 diabetes mellitus (HCC) - Plan: POCT glycosylated hemoglobin (Hb A1C)  Hypertension associated with  diabetes (HCC) - Plan: POCT glycosylated hemoglobin (Hb A1C)  Type 2 diabetes mellitus with complication, without long-term current use of insulin (HCC)   1. Rx changes: none 2. Education: Reviewed 'ABCs' of diabetes management (respective goals in parentheses):  A1C (<7), blood pressure (<130/80), and cholesterol (LDL <100). 3. Compliance at present is estimated to be good. Efforts to improve compliance (if necessary) will be directed at increased exercise. 4. Follow up: 4 months  we also discussed dietary modification since he did admit he ate more than he should've through the holidays. I think he will get his blood numbers under better control with his next visit. He will also set up for complete examination.

## 2015-09-29 ENCOUNTER — Telehealth: Payer: Self-pay | Admitting: Family Medicine

## 2015-09-29 MED ORDER — TADALAFIL 20 MG PO TABS: 20.0000 mg | ORAL_TABLET | Freq: Every day | ORAL | Status: DC | PRN

## 2015-09-29 NOTE — Telephone Encounter (Signed)
Let him know that I called the medicine in 

## 2015-09-29 NOTE — Telephone Encounter (Signed)
Pt says due to changes with his insurance, Levitra is no longer covered and will be very expensive. His insurance informed him that Cialis  is the comparable med that will be covered. Will Dr. Susann Givens change is script to this? Pt would like a call back to let him know.

## 2015-09-30 NOTE — Telephone Encounter (Signed)
Left message for pt

## 2015-10-06 ENCOUNTER — Ambulatory Visit (INDEPENDENT_AMBULATORY_CARE_PROVIDER_SITE_OTHER): Payer: BC Managed Care – PPO | Admitting: Family Medicine

## 2015-10-06 ENCOUNTER — Encounter: Payer: Self-pay | Admitting: Family Medicine

## 2015-10-06 DIAGNOSIS — S40012A Contusion of left shoulder, initial encounter: Secondary | ICD-10-CM | POA: Diagnosis not present

## 2015-10-06 DIAGNOSIS — S7002XA Contusion of left hip, initial encounter: Secondary | ICD-10-CM | POA: Diagnosis not present
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
## 2015-10-06 NOTE — Patient Instructions (Signed)
Tylenol first for pain relief if you need it and return here if there is continued trouble

## 2015-10-20 ENCOUNTER — Other Ambulatory Visit: Payer: Self-pay | Admitting: Family Medicine

## 2015-11-18 ENCOUNTER — Ambulatory Visit (INDEPENDENT_AMBULATORY_CARE_PROVIDER_SITE_OTHER): Payer: BC Managed Care – PPO | Admitting: Family Medicine

## 2015-11-18 ENCOUNTER — Encounter: Payer: Self-pay | Admitting: Family Medicine

## 2015-11-18 ENCOUNTER — Ambulatory Visit
Admission: RE | Admit: 2015-11-18 | Discharge: 2015-11-18 | Disposition: A | Discharge status: Home / Self Care | Payer: BC Managed Care – PPO | Source: Ambulatory Visit | Attending: Family Medicine | Admitting: Family Medicine

## 2015-11-18 VITALS — BP 132/80 | HR 76 | Resp 14 | Ht 69.0 in | Wt 195.2 lb

## 2015-11-18 DIAGNOSIS — M25511 Pain in right shoulder: Secondary | ICD-10-CM

## 2015-11-18 NOTE — Progress Notes (Signed)
   Subjective:    Patient ID: Jacob Swanson, male    DOB: 07/12/1951, 65 y.o.   MRN: [id number]  HPI He complains of a two-week history of difficulty with abduction and external rotation. He has some discomfort when he does try to extend this extent. No previous history of injury, popping, locking, referral of pain.   Review of Systems     Objective:   Physical Exam Limitation of motion with abduction as well as external and internal rotation is noted. No palpable tenderness. Negative drop arm test. Negative sulcus sign. X-ray shows no changes.      Assessment & Plan:  Right shoulder pain - Plan: DG Shoulder Right he most likely has adhesive capsulitis. I will send to Dr. Berline Choughigby for consideration of possible shoulder injection to help with the capsulitis.

## 2015-11-19 LAB — HM DIABETES EYE EXAM

## 2015-12-11 ENCOUNTER — Encounter: Payer: BC Managed Care – PPO | Admitting: Family Medicine

## 2016-01-14 ENCOUNTER — Ambulatory Visit (INDEPENDENT_AMBULATORY_CARE_PROVIDER_SITE_OTHER): Payer: BC Managed Care – PPO | Admitting: Family Medicine

## 2016-01-14 ENCOUNTER — Encounter: Payer: Self-pay | Admitting: Family Medicine

## 2016-01-14 ENCOUNTER — Other Ambulatory Visit: Payer: Self-pay

## 2016-01-14 VITALS — BP 122/80 | HR 88

## 2016-01-14 DIAGNOSIS — E1169 Type 2 diabetes mellitus with other specified complication: Secondary | ICD-10-CM | POA: Diagnosis not present

## 2016-01-14 DIAGNOSIS — E1159 Type 2 diabetes mellitus with other circulatory complications: Secondary | ICD-10-CM | POA: Diagnosis not present

## 2016-01-14 DIAGNOSIS — E118 Type 2 diabetes mellitus with unspecified complications: Secondary | ICD-10-CM

## 2016-01-14 DIAGNOSIS — Z1211 Encounter for screening for malignant neoplasm of colon: Secondary | ICD-10-CM | POA: Diagnosis not present

## 2016-01-14 DIAGNOSIS — N529 Male erectile dysfunction, unspecified: Secondary | ICD-10-CM | POA: Diagnosis not present

## 2016-01-14 DIAGNOSIS — E785 Hyperlipidemia, unspecified: Secondary | ICD-10-CM

## 2016-01-14 DIAGNOSIS — I152 Hypertension secondary to endocrine disorders: Secondary | ICD-10-CM

## 2016-01-14 DIAGNOSIS — Z0000 Encounter for general adult medical examination without abnormal findings: Secondary | ICD-10-CM

## 2016-01-14 DIAGNOSIS — G4730 Sleep apnea, unspecified: Secondary | ICD-10-CM | POA: Diagnosis not present

## 2016-01-14 DIAGNOSIS — Z8619 Personal history of other infectious and parasitic diseases: Secondary | ICD-10-CM

## 2016-01-14 DIAGNOSIS — Z1159 Encounter for screening for other viral diseases: Secondary | ICD-10-CM

## 2016-01-14 DIAGNOSIS — I10 Essential (primary) hypertension: Secondary | ICD-10-CM | POA: Diagnosis not present

## 2016-01-14 LAB — POCT GLYCOSYLATED HEMOGLOBIN (HGB A1C): Hemoglobin A1C: 7.4

## 2016-01-14 LAB — POCT URINALYSIS DIPSTICK
BILIRUBIN UA: NEGATIVE
Blood, UA: NEGATIVE
Glucose, UA: NEGATIVE
KETONES UA: NEGATIVE
Leukocytes, UA: NEGATIVE
Nitrite, UA: NEGATIVE
Protein, UA: NEGATIVE
Urobilinogen, UA: NEGATIVE
pH, UA: 6

## 2016-01-14 LAB — CBC WITH DIFFERENTIAL/PLATELET
BASOS ABS: 0 {cells}/uL (ref 0–200)
Basophils Relative: 0 %
Eosinophils Absolute: 134 cells/uL (ref 15–500)
Eosinophils Relative: 2 %
HEMATOCRIT: 40.3 % (ref 38.5–50.0)
HEMOGLOBIN: 13.3 g/dL (ref 13.2–17.1)
LYMPHS ABS: 2613 {cells}/uL (ref 850–3900)
Lymphocytes Relative: 39 %
MCH: 30.2 pg (ref 27.0–33.0)
MCHC: 33 g/dL (ref 32.0–36.0)
MCV: 91.6 fL (ref 80.0–100.0)
MONO ABS: 402 {cells}/uL (ref 200–950)
MPV: 8.8 fL (ref 7.5–12.5)
Monocytes Relative: 6 %
NEUTROS PCT: 53 %
Neutro Abs: 3551 cells/uL (ref 1500–7800)
Platelets: 289 10*3/uL (ref 140–400)
RBC: 4.4 MIL/uL (ref 4.20–5.80)
RDW: 13.5 % (ref 11.0–15.0)
WBC: 6.7 10*3/uL (ref 4.0–10.5)

## 2016-01-14 LAB — POCT UA - MICROALBUMIN
ALBUMIN/CREATININE RATIO, URINE, POC: 4.4
CREATININE, POC: 155.5 mg/dL
Microalbumin Ur, POC: 6.8 mg/L

## 2016-01-14 LAB — COMPREHENSIVE METABOLIC PANEL
ALBUMIN: 4.2 g/dL (ref 3.6–5.1)
ALT: 26 U/L (ref 9–46)
AST: 25 U/L (ref 10–35)
Alkaline Phosphatase: 75 U/L (ref 40–115)
BILIRUBIN TOTAL: 0.6 mg/dL (ref 0.2–1.2)
BUN: 16 mg/dL (ref 7–25)
CALCIUM: 9.4 mg/dL (ref 8.6–10.3)
CHLORIDE: 103 mmol/L (ref 98–110)
CO2: 25 mmol/L (ref 20–31)
Creat: 1.08 mg/dL (ref 0.70–1.25)
Glucose, Bld: 131 mg/dL — ABNORMAL HIGH (ref 65–99)
Potassium: 4.5 mmol/L (ref 3.5–5.3)
Sodium: 138 mmol/L (ref 135–146)
TOTAL PROTEIN: 7.2 g/dL (ref 6.1–8.1)

## 2016-01-14 LAB — LIPID PANEL
CHOLESTEROL: 210 mg/dL — AB (ref 125–200)
HDL: 56 mg/dL (ref >=40)
LDL Cholesterol: 140 mg/dL — ABNORMAL HIGH (ref <130)
TRIGLYCERIDES: 70 mg/dL (ref <150)
Total CHOL/HDL Ratio: 3.8 Ratio (ref <=5.0)
VLDL: 14 mg/dL (ref <30)

## 2016-01-14 LAB — HEMOGLOBIN A1C
Hgb A1c MFr Bld: 7.4 % — ABNORMAL HIGH (ref <5.7)
MEAN PLASMA GLUCOSE: 166 mg/dL

## 2016-01-14 MED ORDER — ROSUVASTATIN CALCIUM 40 MG PO TABS: 40.0000 mg | ORAL_TABLET | Freq: Every day | ORAL | Status: DC

## 2016-01-14 MED ORDER — METFORMIN HCL 850 MG PO TABS: 850.0000 mg | ORAL_TABLET | Freq: Two times a day (BID) | ORAL | Status: DC

## 2016-01-14 MED ORDER — GLUCOSE BLOOD VI STRP: ORAL_STRIP | Status: DC

## 2016-01-14 NOTE — Progress Notes (Signed)
Subjective:    Patient ID: Jacob Swanson, male    DOB: [birth year], 65 y.o.   MRN: [id number]  HPI  he is here for a complete examination. He has been having right shoulder pain and has seen orthopedics for this. He is presently on Neurontin for this and states that he is feeling better. I do not have the note from the orthopedic office yet. He does have diabetes and takes good care of himself. He checks his blood sugars regularly does not smoke and drinks socially. He exercises regularly. He gets yearly eye exams and does check his feet. Does have underlying erectile dysfunction and uses Cialis with good results. He also has herpes labialis and does use Valtrex as needed. He continues on his   Crestor without difficulty. He also has sleep apnea and is getting good results with his CPAP. His work is going well. He is considering retiring within the next year or 2. He does have plans for his retirement. Social and family history as well as health maintenance and immunizations were reviewed.  He has no other concerns or complaints.   Review of Systems  All other systems reviewed and are negative.      Objective:   Physical Exam BP 122/80 mmHg  Pulse 88  SpO2 98%  General Appearance:    Alert, cooperative, no distress, appears stated age  Head:    Normocephalic, without obvious abnormality, atraumatic  Eyes:    PERRL, conjunctiva/corneas clear, EOM's intact, fundi    benign  Ears:    Normal TM's and external ear canals  Nose:   Nares normal, mucosa normal, no drainage or sinus   tenderness  Throat:   Lips, mucosa, and tongue normal; teeth and gums normal  Neck:   Supple, no lymphadenopathy;  thyroid:  no   enlargement/tenderness/nodules; no carotid   bruit or JVD  Back:    Spine nontender, no curvature, ROM normal, no CVA     tenderness  Lungs:     Clear to auscultation bilaterally without wheezes, rales or     ronchi; respirations unlabored  Chest Wall:    No tenderness or deformity   Heart:    Regular rate and rhythm, S1 and S2 normal, no murmur, rub   or gallop  Breast Exam:    No chest wall tenderness, masses or gynecomastia  Abdomen:     Soft, non-tender, nondistended, normoactive bowel sounds,    no masses, no hepatosplenomegaly        Extremities:   No clubbing, cyanosis or edema  Pulses:   2+ and symmetric all extremities  Skin:   Skin color, texture, turgor normal, no rashes or lesions  Lymph nodes:   Cervical, supraclavicular, and axillary nodes normal  Neurologic:   CNII-XII intact, normal strength, sensation and gait; reflexes 2+ and symmetric throughout          Psych:   Normal mood, affect, hygiene and grooming.    A1c is 7.4       Assessment & Plan:  Routine general medical examination at a health care facility - Plan: Urinalysis Dipstick  Type 2 diabetes mellitus with complication, without long-term current use of insulin (HCC) - Plan: HgB A1c, POCT UA - Microalbumin, Urinalysis Dipstick, CBC with Differential/Platelet, Comprehensive metabolic panel, Lipid panel, metFORMIN (GLUCOPHAGE) 850 MG tablet, glucose blood (ACCU-CHEK AVIVA PLUS) test strip  Hyperlipidemia associated with type 2 diabetes mellitus (HCC) - Plan: Lipid panel, rosuvastatin (CRESTOR) 40 MG tablet  Hypertension associated with  diabetes (HCC) - Plan: CBC with Differential/Platelet, Comprehensive metabolic panel  Sleep apnea  Erectile dysfunction, unspecified erectile dysfunction type  Need for hepatitis C screening test - Plan: Hepatitis C antibody  Screen for colon cancer - Plan: Ambulatory referral to Gastroenterology  overall he is doing good at taking care of himself. Routine blood work will be ordered. He will continue on his CPAP. He will call when he needs Cialis renewed. Sent for information concerning the orthopedic visit. He will also be set up for colonoscopy.

## 2016-01-15 LAB — HEPATITIS C ANTIBODY: HCV AB: NEGATIVE

## 2016-03-23 ENCOUNTER — Telehealth: Payer: Self-pay

## 2016-03-23 NOTE — Telephone Encounter (Signed)
Need CPAP report

## 2016-03-23 NOTE — Telephone Encounter (Signed)
I have pt down load and called pt to inform him Dr.Lalonde will be in Thursday and we will have a RX and his read out ready

## 2016-03-23 NOTE — Telephone Encounter (Signed)
Pt called the office to let you know that his CPAP machine has quit working. Advised pt to contact supplier and he states he is not sure who this is because the machine is 65 yrs old. Pt is not sure what needs to be done. Please advise. Trixie Rude

## 2016-03-25 NOTE — Telephone Encounter (Signed)
I have put RX and papers up front for pt to pick up

## 2016-05-03 ENCOUNTER — Encounter: Payer: Self-pay | Admitting: Family Medicine

## 2016-05-03 ENCOUNTER — Ambulatory Visit (INDEPENDENT_AMBULATORY_CARE_PROVIDER_SITE_OTHER): Payer: BC Managed Care – PPO | Admitting: Family Medicine

## 2016-05-03 VITALS — BP 120/70 | HR 76 | Ht 69.0 in | Wt 201.0 lb

## 2016-05-03 DIAGNOSIS — E785 Hyperlipidemia, unspecified: Secondary | ICD-10-CM

## 2016-05-03 DIAGNOSIS — N529 Male erectile dysfunction, unspecified: Secondary | ICD-10-CM | POA: Diagnosis not present

## 2016-05-03 DIAGNOSIS — E1169 Type 2 diabetes mellitus with other specified complication: Secondary | ICD-10-CM

## 2016-05-03 DIAGNOSIS — Z1211 Encounter for screening for malignant neoplasm of colon: Secondary | ICD-10-CM | POA: Diagnosis not present

## 2016-05-03 DIAGNOSIS — Z136 Encounter for screening for cardiovascular disorders: Secondary | ICD-10-CM | POA: Diagnosis not present

## 2016-05-03 DIAGNOSIS — E118 Type 2 diabetes mellitus with unspecified complications: Secondary | ICD-10-CM

## 2016-05-03 DIAGNOSIS — G4730 Sleep apnea, unspecified: Secondary | ICD-10-CM | POA: Diagnosis not present

## 2016-05-03 DIAGNOSIS — I10 Essential (primary) hypertension: Secondary | ICD-10-CM

## 2016-05-03 DIAGNOSIS — I152 Hypertension secondary to endocrine disorders: Secondary | ICD-10-CM

## 2016-05-03 DIAGNOSIS — E1159 Type 2 diabetes mellitus with other circulatory complications: Secondary | ICD-10-CM

## 2016-05-03 LAB — POCT GLYCOSYLATED HEMOGLOBIN (HGB A1C): Hemoglobin A1C: 7.6

## 2016-05-03 NOTE — Progress Notes (Signed)
  Subjective:    Patient ID: Jacob Swanson, male    DOB: 01/30/1951, 65 y.o.   MRN: [id number]  Jacob Dewlton Koo is a 65 y.o. male who presents for follow-up of Type 2 diabetes mellitus.  Patient is checking home blood sugars.   Home blood sugar records: 123 to 160 How often is blood sugars being checked: BID Current symptoms/problem none Daily foot checks: yes   Any foot concerns: none Last eye exam: 11/19/15 Exercise weight lifting and running He continues on over-the-counter medications as well as metformin. He continues on Crestor and is having no trouble with that. He also [facility] for his hypertension and Cialis for ED. He has not had to take the Valtrex recently. He does have sleep apnea and is doing quite nicely with his CPAP and he does have a remote history of smoking apparently over 25 years ago. The following portions of the patient's history were reviewed and updated as appropriate: allergies, current medications, past medical history, past social history and problem list.  ROS as in subjective above.     Objective:    Physical Exam Alert and in no distress otherwise not examined.   Lab Review Diabetic Labs Latest Ref Rng & Units 01/14/2016 01/14/2016 08/08/2015 04/14/2015 12/10/2014  HbA1c - 7.4 7.4(H) 7.6% 6.9 7.5  Microalbumin mg/L 6.8 - - - 11.4  Micro/Creat Ratio - 4.4 - - - 2.8  Chol 125 - 200 mg/dL 409(W210(H) - - - -  HDL >=11>=40 mg/dL 56 - - - -  Calc LDL <914<130 mg/dL 782(N140(H) - - - -  Triglycerides <150 mg/dL 70 - - - -  Creatinine 0.70 - 1.25 mg/dL 5.621.08 - - - -   BP/Weight 01/14/2016 11/18/2015 10/06/2015 08/08/2015 04/14/2015  Systolic BP 122 132 118 124 130  Diastolic BP 80 80 76 78 84  Wt. (Lbs) - 195.2 197 203 194  BMI - 28.81 29.08 29.96 28.64   Foot/eye exam completion dates Latest Ref Rng & Units 11/19/2015 11/06/2014  Eye Exam No Retinopathy No Retinopathy No Retinopathy  Foot Form Completion - - -  A1c is 7.6  Leory Plowmanlton  reports that he has never smoked. He has  never used smokeless tobacco. He reports that he drinks alcohol. He reports that he does not use drugs.     Assessment & Plan:    Screen for colon cancer  Type 2 diabetes mellitus with complication, without long-term current use of insulin (HCC)  Hyperlipidemia associated with type 2 diabetes mellitus (HCC)  Hypertension associated with diabetes (HCC)  Sleep apnea  Erectile dysfunction, unspecified erectile dysfunction type  Screening for AAA (abdominal aortic aneurysm)   1. Rx changes: none 2. Education: Reviewed 'ABCs' of diabetes management (respective goals in parentheses):  A1C (<7), blood pressure (<130/80), and cholesterol (LDL <100). 3. Compliance at present is estimated to be good. Efforts to improve compliance (if necessary) will be directed at No change. 4. Follow up: 4 months

## 2016-05-05 ENCOUNTER — Telehealth: Payer: Self-pay | Admitting: Internal Medicine

## 2016-05-05 NOTE — Telephone Encounter (Signed)
Left message for pt to call me back.   Patient is scheduled for 05/17/16 @ 8:30am. Arrive at 8:10am. Nothing to eat or drink after midnight. Patient is scheduled for an [place] [employer]. [facility] [address] [place], [place] [id number]. Phone number [phone]

## 2016-05-17 ENCOUNTER — Ambulatory Visit
Admission: RE | Admit: 2016-05-17 | Discharge: 2016-05-17 | Disposition: A | Discharge status: Home / Self Care | Payer: BC Managed Care – PPO | Source: Ambulatory Visit | Attending: Family Medicine | Admitting: Family Medicine

## 2016-05-17 DIAGNOSIS — Z136 Encounter for screening for cardiovascular disorders: Secondary | ICD-10-CM

## 2016-08-08 ENCOUNTER — Other Ambulatory Visit: Payer: Self-pay | Admitting: Family Medicine

## 2016-08-08 DIAGNOSIS — E118 Type 2 diabetes mellitus with unspecified complications: Secondary | ICD-10-CM

## 2016-08-17 ENCOUNTER — Other Ambulatory Visit: Payer: Self-pay | Admitting: Family Medicine

## 2016-09-01 ENCOUNTER — Ambulatory Visit (INDEPENDENT_AMBULATORY_CARE_PROVIDER_SITE_OTHER): Payer: BC Managed Care – PPO | Admitting: Family Medicine

## 2016-09-01 ENCOUNTER — Encounter: Payer: Self-pay | Admitting: Family Medicine

## 2016-09-01 VITALS — BP 130/84 | HR 100 | Ht 69.0 in | Wt 196.6 lb

## 2016-09-01 DIAGNOSIS — E1159 Type 2 diabetes mellitus with other circulatory complications: Secondary | ICD-10-CM | POA: Diagnosis not present

## 2016-09-01 DIAGNOSIS — N529 Male erectile dysfunction, unspecified: Secondary | ICD-10-CM

## 2016-09-01 DIAGNOSIS — E785 Hyperlipidemia, unspecified: Secondary | ICD-10-CM | POA: Diagnosis not present

## 2016-09-01 DIAGNOSIS — E118 Type 2 diabetes mellitus with unspecified complications: Secondary | ICD-10-CM

## 2016-09-01 DIAGNOSIS — E1169 Type 2 diabetes mellitus with other specified complication: Secondary | ICD-10-CM

## 2016-09-01 DIAGNOSIS — I152 Hypertension secondary to endocrine disorders: Secondary | ICD-10-CM

## 2016-09-01 DIAGNOSIS — I10 Essential (primary) hypertension: Secondary | ICD-10-CM

## 2016-09-01 LAB — POCT GLYCOSYLATED HEMOGLOBIN (HGB A1C): Hemoglobin A1C: 8.2

## 2016-09-01 NOTE — Progress Notes (Signed)
  Subjective:    Patient ID: Jacob Swanson, male    DOB: 11/10/1950, 66 y.o.   MRN: [id number]  Jacob Swanson is a 66 y.o. male who presents for follow-up of Type 2 diabetes mellitus.  Patient is checking home blood sugars.   Home blood sugar records: 140 to 160 How often is blood sugars being checked: BID Current symptoms/problems numbness in right hand and right foot Daily foot checks: yes   Any foot concerns: right foot  Last eye exam: 3/17 [place] [facility] x [facility] He is taking metformin regularly. He continues on Crestor as well as his TRIBENZOR. He did have a very good holiday season. Cialis continues to work well for him. He is on multiple dietary supplements. The following portions of the patient's history were reviewed and updated as appropriate: allergies, current medications, past medical history, past social history and problem list.  ROS as in subjective above.     Objective:    Physical Exam Alert and in no distress otherwise not examined.   Lab Review Diabetic Labs Latest Ref Rng & Units 05/03/2016 01/14/2016 01/14/2016 08/08/2015 04/14/2015  HbA1c - 7.6 7.4 7.4(H) 7.6% 6.9  Microalbumin mg/L - 6.8 - - -  Micro/Creat Ratio - - 4.4 - - -  Chol 125 - 200 mg/dL - 782(N210(H) - - -  HDL >=56>=40 mg/dL - 56 - - -  Calc LDL <213<130 mg/dL - 086(V140(H) - - -  Triglycerides <150 mg/dL - 70 - - -  Creatinine 0.70 - 1.25 mg/dL - 7.841.08 - - -   BP/Weight 05/03/2016 01/14/2016 11/18/2015 10/06/2015 08/08/2015  Systolic BP 120 122 132 118 124  Diastolic BP 70 80 80 76 78  Wt. (Lbs) 201 - 195.2 197 203  BMI 29.68 - 28.81 29.08 29.96   Foot/eye exam completion dates Latest Ref Rng & Units 11/19/2015 11/06/2014  Eye Exam No Retinopathy No Retinopathy No Retinopathy  Foot Form Completion - - -  A1c is 8.2  Jacob Swanson  reports that he has never smoked. He has never used smokeless tobacco. He reports that he drinks alcohol. He reports that he does not use drugs.     Assessment & Plan:    Type 2  diabetes mellitus with complication, without long-term current use of insulin (HCC)  Hyperlipidemia associated with type 2 diabetes mellitus (HCC)  Hypertension associated with diabetes (HCC)  Erectile dysfunction, unspecified erectile dysfunction type   1. Rx changes: none 2. Education: Reviewed 'ABCs' of diabetes management (respective goals in parentheses):  A1C (<7), blood pressure (<130/80), and cholesterol (LDL <100). 3. Compliance at present is estimated to be good. Efforts to improve compliance (if necessary) will be directed at Check blood sugars 2 hours after meals.. 4. Follow up: 4 months View of his record indicates he has had elevated A1c is around this time of your which is probably related to the holidays. We will recheck this in 4 months and if his A1c remains elevated I will readjust his medications.

## 2016-11-08 ENCOUNTER — Other Ambulatory Visit: Payer: Self-pay | Admitting: Family Medicine

## 2017-01-03 ENCOUNTER — Ambulatory Visit (INDEPENDENT_AMBULATORY_CARE_PROVIDER_SITE_OTHER): Payer: BC Managed Care – PPO | Admitting: Family Medicine

## 2017-01-03 ENCOUNTER — Encounter: Payer: Self-pay | Admitting: Family Medicine

## 2017-01-03 VITALS — BP 120/80 | HR 76 | Ht 69.0 in | Wt 195.6 lb

## 2017-01-03 DIAGNOSIS — E118 Type 2 diabetes mellitus with unspecified complications: Secondary | ICD-10-CM | POA: Diagnosis not present

## 2017-01-03 DIAGNOSIS — I10 Essential (primary) hypertension: Secondary | ICD-10-CM

## 2017-01-03 DIAGNOSIS — E785 Hyperlipidemia, unspecified: Secondary | ICD-10-CM | POA: Diagnosis not present

## 2017-01-03 DIAGNOSIS — N529 Male erectile dysfunction, unspecified: Secondary | ICD-10-CM

## 2017-01-03 DIAGNOSIS — Z8619 Personal history of other infectious and parasitic diseases: Secondary | ICD-10-CM

## 2017-01-03 DIAGNOSIS — E1169 Type 2 diabetes mellitus with other specified complication: Secondary | ICD-10-CM | POA: Diagnosis not present

## 2017-01-03 DIAGNOSIS — I152 Hypertension secondary to endocrine disorders: Secondary | ICD-10-CM

## 2017-01-03 DIAGNOSIS — E1159 Type 2 diabetes mellitus with other circulatory complications: Secondary | ICD-10-CM

## 2017-01-03 DIAGNOSIS — G4730 Sleep apnea, unspecified: Secondary | ICD-10-CM

## 2017-01-03 LAB — CBC WITH DIFFERENTIAL/PLATELET
BASOS ABS: 0 {cells}/uL (ref 0–200)
Basophils Relative: 0 %
EOS PCT: 4 %
Eosinophils Absolute: 260 cells/uL (ref 15–500)
HCT: 41.5 % (ref 38.5–50.0)
Hemoglobin: 13.9 g/dL (ref 13.2–17.1)
Lymphocytes Relative: 45 %
Lymphs Abs: 2925 cells/uL (ref 850–3900)
MCH: 30.6 pg (ref 27.0–33.0)
MCHC: 33.5 g/dL (ref 32.0–36.0)
MCV: 91.4 fL (ref 80.0–100.0)
MONOS PCT: 7 %
MPV: 8.6 fL (ref 7.5–12.5)
Monocytes Absolute: 455 cells/uL (ref 200–950)
NEUTROS PCT: 44 %
Neutro Abs: 2860 cells/uL (ref 1500–7800)
PLATELETS: 247 10*3/uL (ref 140–400)
RBC: 4.54 MIL/uL (ref 4.20–5.80)
RDW: 13.3 % (ref 11.0–15.0)
WBC: 6.5 10*3/uL (ref 4.0–10.5)

## 2017-01-03 LAB — POCT UA - MICROALBUMIN
ALBUMIN/CREATININE RATIO, URINE, POC: 54.9
Creatinine, POC: 27.3 mg/dL
MICROALBUMIN (UR) POC: 15 mg/L

## 2017-01-03 LAB — POCT GLYCOSYLATED HEMOGLOBIN (HGB A1C): HEMOGLOBIN A1C: 8.3

## 2017-01-03 NOTE — Progress Notes (Signed)
Subjective:    Patient ID: Jacob Swanson, male    DOB: 09/23/1950, 66 y.o.   MRN: [id number]  Jacob Dewlton Merta is a 66 y.o. male who presents for follow-up of Type 2 diabetes mellitus.  Patient is checking home blood sugars.   Home blood sugar records: 140 to 197 How often is blood sugars being checked: BID Current symptoms/problems None Daily foot checks: yes   Any foot concerns: none Last eye exam: 10/2015 Dr.Whittaker [facility]  He continues on [place] for his blood pressure without difficulty. He doesn't use Valtrex for his herpes labialis. He does note that Cialis is being less beneficial as in the past. Continues on Crestor and is having no muscle aches or pains. He does have an appointment set up to see his ophthalmologist. The following portions of the patient's history were reviewed and updated as appropriate: allergies, current medications, past medical history, past social history and problem list.  ROS as in subjective above.     Objective:    Physical Exam Alert and in no distress otherwise not examined.   Lab Review Diabetic Labs Latest Ref Rng & Units 09/01/2016 05/03/2016 01/14/2016 01/14/2016 08/08/2015  HbA1c - 8.2 7.6 7.4 7.4(H) 7.6%  Microalbumin mg/L - - 6.8 - -  Micro/Creat Ratio - - - 4.4 - -  Chol 125 - 200 mg/dL - - 409(W210(H) - -  HDL >=11>=40 mg/dL - - 56 - -  Calc LDL <914<130 mg/dL - - 782(N140(H) - -  Triglycerides <150 mg/dL - - 70 - -  Creatinine 0.70 - 1.25 mg/dL - - 5.621.08 - -   BP/Weight 09/01/2016 05/03/2016 01/14/2016 11/18/2015 10/06/2015  Systolic BP 130 120 122 132 118  Diastolic BP 84 70 80 80 76  Wt. (Lbs) 196.6 201 - 195.2 197  BMI 29.03 29.68 - 28.81 29.08   Foot/eye exam completion dates Latest Ref Rng & Units 11/19/2015 11/06/2014  Eye Exam No Retinopathy No Retinopathy No Retinopathy  Foot Form Completion - - -  A1c is 8.3  Leory Plowmanlton  reports that he has never smoked. He has never used smokeless tobacco. He reports that he drinks alcohol.  He reports that he does not use drugs.     Assessment & Plan:    Type 2 diabetes mellitus with complication, without long-term current use of insulin (HCC) - Plan: POCT UA - Microalbumin, HgB A1c, CBC with Differential/Platelet, Comprehensive metabolic panel, Lipid panel  Hyperlipidemia associated with type 2 diabetes mellitus (HCC) - Plan: Lipid panel  Hypertension associated with diabetes (HCC) - Plan: CBC with Differential/Platelet, Comprehensive metabolic panel  Sleep apnea, unspecified type  Erectile dysfunction, unspecified erectile dysfunction type  H/O herpes labialis 1.  2. Rx changes: none 3. Education: Reviewed 'ABCs' of diabetes management (respective goals in parentheses):  A1C (<7), blood pressure (<130/80), and cholesterol (LDL <100). 4. Compliance at present is estimated to be fair. Efforts to improve compliance (if necessary) will be directed at dietary modifications: He plans to do a better job with his diet in general.. 5. Follow up: 4 months He will continue on his present medication regimen however I explained to him that if his dietary modification as well as for continued exercise he is doing has not had a positive effect on his A1c, I will need add another medication to his regimen. He will return here in 4 months. Encouraged him to get a CPAP readout to make sure that the machine is working properly.

## 2017-01-04 ENCOUNTER — Other Ambulatory Visit: Payer: Self-pay

## 2017-01-04 LAB — COMPREHENSIVE METABOLIC PANEL
ALBUMIN: 4.5 g/dL (ref 3.6–5.1)
ALT: 16 U/L (ref 9–46)
AST: 15 U/L (ref 10–35)
Alkaline Phosphatase: 77 U/L (ref 40–115)
BUN: 22 mg/dL (ref 7–25)
CO2: 23 mmol/L (ref 20–31)
CREATININE: 1.39 mg/dL — AB (ref 0.70–1.25)
Calcium: 10.3 mg/dL (ref 8.6–10.3)
Chloride: 101 mmol/L (ref 98–110)
Glucose, Bld: 201 mg/dL — ABNORMAL HIGH (ref 65–99)
POTASSIUM: 4.3 mmol/L (ref 3.5–5.3)
SODIUM: 136 mmol/L (ref 135–146)
Total Bilirubin: 0.4 mg/dL (ref 0.2–1.2)
Total Protein: 7.5 g/dL (ref 6.1–8.1)

## 2017-01-04 LAB — LIPID PANEL
CHOLESTEROL: 241 mg/dL — AB (ref <200)
HDL: 57 mg/dL (ref >40)
LDL Cholesterol: 167 mg/dL — ABNORMAL HIGH (ref <100)
TRIGLYCERIDES: 85 mg/dL (ref <150)
Total CHOL/HDL Ratio: 4.2 Ratio (ref <5.0)
VLDL: 17 mg/dL (ref <30)

## 2017-01-04 MED ORDER — EZETIMIBE 10 MG PO TABS: 10.0000 mg | ORAL_TABLET | Freq: Every day | ORAL | 3 refills | Status: DC

## 2017-02-22 ENCOUNTER — Other Ambulatory Visit: Payer: Self-pay | Admitting: Family Medicine

## 2017-02-22 DIAGNOSIS — E118 Type 2 diabetes mellitus with unspecified complications: Secondary | ICD-10-CM

## 2017-03-07 ENCOUNTER — Other Ambulatory Visit: Payer: Self-pay | Admitting: Family Medicine

## 2017-04-09 ENCOUNTER — Other Ambulatory Visit: Payer: Self-pay | Admitting: Family Medicine

## 2017-04-09 DIAGNOSIS — E118 Type 2 diabetes mellitus with unspecified complications: Secondary | ICD-10-CM

## 2017-05-04 LAB — HM DIABETES EYE EXAM

## 2017-05-09 ENCOUNTER — Ambulatory Visit (INDEPENDENT_AMBULATORY_CARE_PROVIDER_SITE_OTHER): Payer: Medicare Other | Admitting: Family Medicine

## 2017-05-09 ENCOUNTER — Encounter: Payer: Self-pay | Admitting: Family Medicine

## 2017-05-09 VITALS — BP 120/80 | HR 68 | Wt 198.4 lb

## 2017-05-09 DIAGNOSIS — I10 Essential (primary) hypertension: Secondary | ICD-10-CM

## 2017-05-09 DIAGNOSIS — E118 Type 2 diabetes mellitus with unspecified complications: Secondary | ICD-10-CM

## 2017-05-09 DIAGNOSIS — E1159 Type 2 diabetes mellitus with other circulatory complications: Secondary | ICD-10-CM | POA: Diagnosis not present

## 2017-05-09 DIAGNOSIS — E785 Hyperlipidemia, unspecified: Secondary | ICD-10-CM

## 2017-05-09 DIAGNOSIS — I152 Hypertension secondary to endocrine disorders: Secondary | ICD-10-CM

## 2017-05-09 DIAGNOSIS — E1169 Type 2 diabetes mellitus with other specified complication: Secondary | ICD-10-CM | POA: Diagnosis not present

## 2017-05-09 DIAGNOSIS — Z23 Encounter for immunization: Secondary | ICD-10-CM | POA: Diagnosis not present

## 2017-05-09 LAB — POCT GLYCOSYLATED HEMOGLOBIN (HGB A1C): Hemoglobin A1C: 7

## 2017-05-09 MED ORDER — ROSUVASTATIN CALCIUM 40 MG PO TABS: 40.0000 mg | ORAL_TABLET | Freq: Every day | ORAL | 3 refills | Status: DC

## 2017-05-09 NOTE — Progress Notes (Signed)
  Subjective:    Patient ID: Jacob Swanson, male    DOB: 1950/09/27, 67 y.o.   MRN: [id number]  Jacob Swanson is a 66 y.o. male who presents for follow-up of Type 2 diabetes mellitus.  Patient is checking home blood sugars.   Home blood sugar records: BGs range between 125 and 156 How often is blood sugars being checked: twice a day Current symptoms/problems include none and have been improving. Daily foot checks: yes   Any foot concerns: none Last eye exam: last week [employer] Exercise: 3 times a week Review of the record indicates that he is not taking Crestor. He was confused and thought that he was only supposed to be on one medication. He is taking metformin without difficulty. He continues on his blood pressure medication and having no difficulty with that. Has made some diet and exercise changes. The following portions of the patient's history were reviewed and updated as appropriate: allergies, current medications, past medical history, past social history and problem list.  ROS as in subjective above.     Objective:    Physical Exam Alert and in no distress otherwise not examined.   Lab Review Diabetic Labs Latest Ref Rng & Units 01/03/2017 09/01/2016 05/03/2016 01/14/2016 01/14/2016  HbA1c - 8.3 8.2 7.6 7.4 7.4(H)  Microalbumin mg/L 15.0 - - 6.8 -  Micro/Creat Ratio - 54.9 - - 4.4 -  Chol <200 mg/dL 409(W) - - 119(J) -  HDL >40 mg/dL 57 - - 56 -  Calc LDL <478 mg/dL 295(A) - - 213(Y) -  Triglycerides <150 mg/dL 85 - - 70 -  Creatinine 0.70 - 1.25 mg/dL 8.65(H) - - 8.46 -   BP/Weight 01/03/2017 09/01/2016 05/03/2016 01/14/2016 11/18/2015  Systolic BP 120 130 120 122 132  Diastolic BP 80 84 70 80 80  Wt. (Lbs) 195.6 196.6 201 - 195.2  BMI 28.89 29.03 29.68 - 28.81   Foot/eye exam completion dates Latest Ref Rng & Units 01/03/2017 11/19/2015  Eye Exam No Retinopathy - No Retinopathy  Foot Form Completion - Done -  A1c is 7.0  Jacob Swanson  reports that he has never smoked. He has  never used smokeless tobacco. He reports that he drinks alcohol. He reports that he does not use drugs.     Assessment & Plan:    Need for influenza vaccination - Plan: Flu vaccine HIGH DOSE PF (Fluzone High dose)  Hyperlipidemia associated with type 2 diabetes mellitus (HCC) - Plan: rosuvastatin (CRESTOR) 40 MG tablet, HgB A1c  Hypertension associated with diabetes (HCC)  Type 2 diabetes mellitus with complication, without long-term current use of insulin (HCC)   1. Rx changes: none 2. Education: Reviewed 'ABCs' of diabetes management (respective goals in parentheses):  A1C (<7), blood pressure (<130/80), and cholesterol (LDL <100). 3. Compliance at present is estimated to be excellent. Efforts to improve compliance (if necessary) will be directed at Continue with present diet and exercise regimen. 4. Follow up: 4 months he will also set up for complete exam with his next visit

## 2017-05-11 ENCOUNTER — Ambulatory Visit (INDEPENDENT_AMBULATORY_CARE_PROVIDER_SITE_OTHER): Payer: Medicare Other | Admitting: Family Medicine

## 2017-05-11 ENCOUNTER — Encounter: Payer: Self-pay | Admitting: Family Medicine

## 2017-05-11 VITALS — BP 142/80 | HR 78 | Resp 16 | Ht 68.0 in | Wt 195.2 lb

## 2017-05-11 DIAGNOSIS — Z0000 Encounter for general adult medical examination without abnormal findings: Secondary | ICD-10-CM

## 2017-05-11 DIAGNOSIS — Z1211 Encounter for screening for malignant neoplasm of colon: Secondary | ICD-10-CM

## 2017-05-11 DIAGNOSIS — E1169 Type 2 diabetes mellitus with other specified complication: Secondary | ICD-10-CM

## 2017-05-11 DIAGNOSIS — E1159 Type 2 diabetes mellitus with other circulatory complications: Secondary | ICD-10-CM

## 2017-05-11 DIAGNOSIS — M25561 Pain in right knee: Secondary | ICD-10-CM | POA: Diagnosis not present

## 2017-05-11 DIAGNOSIS — E785 Hyperlipidemia, unspecified: Secondary | ICD-10-CM

## 2017-05-11 DIAGNOSIS — I10 Essential (primary) hypertension: Secondary | ICD-10-CM | POA: Diagnosis not present

## 2017-05-11 DIAGNOSIS — E118 Type 2 diabetes mellitus with unspecified complications: Secondary | ICD-10-CM | POA: Diagnosis not present

## 2017-05-11 DIAGNOSIS — Z87891 Personal history of nicotine dependence: Secondary | ICD-10-CM | POA: Diagnosis not present

## 2017-05-11 DIAGNOSIS — Z23 Encounter for immunization: Secondary | ICD-10-CM

## 2017-05-11 DIAGNOSIS — I152 Hypertension secondary to endocrine disorders: Secondary | ICD-10-CM

## 2017-05-11 NOTE — Progress Notes (Signed)
Jacob Swanson is a 66 y.o. male who presents for annual wellness visit complete exam and follow-up on chronic medical conditions.  He has the following concerns: He does have underlying diabetes however he recently had an evaluation for that. He does complain of some difficulty with right knee pain is intermittent in nature and he usually notices this when he has his knee in a very flexed position. He states that it sometimes feels as if it pops back in place and then the pain goes away. He has underlying diabetes and is on Crestor and Zetia for his lipids which will need to be followed up on this as he not taking both medications. He is also taking  olmesartan/amlodipine for his blood pressure and metformin for his diabetes. Last A1c was 6.9. He does not smoke  Does exercise fairly regularly He has remote history of smoking several years ago. Drinking is minimal. He continues to work and has no plans for retirement. Otherwise family and social history as well as health maintenance and immunizations are recorded  Immunizations and Health Maintenance Immunization History  Administered Date(s) Administered  . Influenza Whole 07/07/2009, 04/21/2011  . Influenza, High Dose Seasonal PF 05/09/2017  . Influenza, Seasonal, Injecte, Preservative Fre 08/08/2012  . Influenza,inj,Quad PF,6+ Mos 08/10/2013, 04/14/2015  . Influenza-Unspecified 04/23/2014, 05/12/2016  . Pneumococcal Conjugate-13 09/10/2014  . Pneumococcal Polysaccharide-23 05/19/2005  . Td 04/22/2003  . Tdap 08/08/2012  . Zoster 07/13/2011   Health Maintenance Due  Topic Date Due  . PNA vac Low Risk Adult (2 of 2 - PPSV23) 09/11/2015  . COLONOSCOPY  05/30/2016  . OPHTHALMOLOGY EXAM  11/18/2016    Last colonoscopy: 2007  Last PSA: n/a  Dentist: Dr. Iran Planas Long- [facility] Ophtho: [employer]- last week.  Exercise: 3-4 times a week at the [facility]   Other doctors caring for patient include: n/a  Advanced Directives: no Information given     Depression screen:  See questionnaire below.     Depression screen [facility] 2/9 05/11/2017 01/14/2016 09/10/2014 08/10/2013 08/08/2012  Decreased Interest 0 0 0 0 0  Down, Depressed, Hopeless 0 0 0 0 0  PHQ - 2 Score 0 0 0 0 0    Fall Screen: See Questionaire below.   Fall Risk  05/11/2017 01/14/2016 09/10/2014 08/10/2013 08/08/2012  Falls in the past year? No No No No No    ADL screen:  See questionnaire below.  Functional Status Survey:  negative Review of Systems  Constitutional: -, -unexpected weight change, -anorexia, -fatigue Allergy: -sneezing, -itching, -congestion Dermatology: denies changing moles, rash, lumps ENT: -runny nose, -ear pain, -sore throat,  Cardiology:  -chest pain, -palpitations, -orthopnea, Respiratory: -cough, -shortness of breath, -dyspnea on exertion, -wheezing,  Gastroenterology: -abdominal pain, -nausea, -vomiting, -diarrhea, -constipation, -dysphagia Hematology: -bleeding or bruising problems Musculoskeletal:  -myalgias, -joint swelling, -back pain, - Ophthalmology: -vision changes,  Urology: -dysuria, -difficulty urinating,  -urinary frequency, -urgency, incontinence Neurology: -, -numbness, , -memory loss, -falls, -dizziness    PHYSICAL EXAM: General Appearance: Alert, cooperative, no distress, appears stated age Head: Normocephalic, without obvious abnormality, atraumatic Eyes: PERRL, conjunctiva/corneas clear, EOM's intact, fundi benign Ears: Normal TM's and external ear canals Nose: Nares normal, mucosa normal, no drainage or sinus   tenderness Throat: Lips, mucosa, and tongue normal; teeth and gums normal Neck: Supple, no lymphadenopathy, thyroid:no enlargement/tenderness/nodules; no carotid bruit or JVD Lungs: Clear to auscultation bilaterally without wheezes, rales or ronchi; respirations unlabored Heart: Regular rate and rhythm, S1 and S2 normal, no murmur, rub or gallop Abdomen:  Soft, non-tender, nondistended, normoactive bowel sounds, no  masses, no hepatosplenomegaly Extremities: No clubbing, cyanosis or edema. Exam of the right knee shows no effusion. No tenderness over the patella or patellar tendon or joints. McMurray's testing negative. Medial and lateral collateral ligaments intact. Negative anterior drawer. Pulses: 2+ and symmetric all extremities Skin: Skin color, texture, turgor normal, no rashes or lesions Lymph nodes: Cervical, supraclavicular, and axillary nodes normal Neurologic: CNII-XII intact, normal strength, sensation and gait; reflexes 2+ and symmetric throughout   Psych: Normal mood, affect, hygiene and grooming  ASSESSMENT/PLAN: Routine general medical examination at a health care facility  Need for shingles vaccine - Plan: Varicella-zoster vaccine IM (Shingrix)  Screening for colon cancer - Plan: Cologuard  Former smoker, stopped smoking in distant past - Plan: [place] Screening AAA  Right knee pain, unspecified chronicity  Hyperlipidemia associated with type 2 diabetes mellitus (HCC)  Hypertension associated with diabetes (HCC)  Type 2 diabetes mellitus with complication, without long-term current use of insulin (HCC) Reassured him that I did not think anything of significance occurs with his knee. He will continue on his other medications for the above diagnoses He will be screened for colon cancer as well as AAA. Continue to take excellent care of himself in terms of his diabetes.   Medicare Attestation I have personally reviewed: The patient's medical and social history Their use of alcohol, tobacco or illicit drugs Their current medications and supplements The patient's functional ability including ADLs,fall risks, home safety risks, cognitive, and hearing and visual impairment Diet and physical activities Evidence for depression or mood disorders  The patient's weight, height, and BMI have been recorded in the chart.  I have made referrals, counseling, and provided education to the patient  based on review of the above and I have provided the patient with a written personalized care plan for preventive services.     Carollee Herter, MD   05/11/2017

## 2017-05-11 NOTE — Patient Instructions (Signed)
  Mr. Jacob Swanson , Thank you for taking time to come for your Medicare Wellness Visit. I appreciate your ongoing commitment to your health goals. Please review the following plan we discussed and let me know if I can assist you in the future.   These are the goals we discussed: Goals    None      This is a list of the screening recommended for you and due dates:  Health Maintenance  Topic Date Due  . Pneumonia vaccines (2 of 2 - PPSV23) 09/11/2015  . Colon Cancer Screening  05/30/2016  . Eye exam for diabetics  11/18/2016  . Hemoglobin A1C  11/06/2017  . Complete foot exam   01/03/2018  . Tetanus Vaccine  08/08/2022  . Flu Shot  Completed  .  Hepatitis C: One time screening is recommended by Center for Disease Control  (CDC) for  adults born from [birth year] through 1965.   Completed

## 2017-05-18 ENCOUNTER — Telehealth: Payer: Self-pay | Admitting: Family Medicine

## 2017-05-18 NOTE — Telephone Encounter (Signed)
Received requested diabetic eye report. Sending back for review.

## 2017-05-20 ENCOUNTER — Encounter: Payer: Self-pay | Admitting: *Deleted

## 2017-05-22 LAB — COLOGUARD: COLOGUARD: NEGATIVE

## 2017-05-26 ENCOUNTER — Encounter: Payer: Self-pay | Admitting: Internal Medicine

## 2017-05-26 ENCOUNTER — Ambulatory Visit
Admission: RE | Admit: 2017-05-26 | Discharge: 2017-05-26 | Disposition: A | Discharge status: Home / Self Care | Payer: Medicare Other | Source: Ambulatory Visit | Attending: Family Medicine | Admitting: Family Medicine

## 2017-05-26 DIAGNOSIS — Z87891 Personal history of nicotine dependence: Secondary | ICD-10-CM

## 2017-05-26 DIAGNOSIS — I700 Atherosclerosis of aorta: Secondary | ICD-10-CM | POA: Insufficient documentation

## 2017-06-02 ENCOUNTER — Encounter: Payer: Self-pay | Admitting: Family Medicine

## 2017-09-04 ENCOUNTER — Other Ambulatory Visit: Payer: Self-pay | Admitting: Family Medicine

## 2017-09-04 DIAGNOSIS — E118 Type 2 diabetes mellitus with unspecified complications: Secondary | ICD-10-CM

## 2017-09-08 ENCOUNTER — Encounter: Payer: Self-pay | Admitting: Family Medicine

## 2017-09-08 ENCOUNTER — Telehealth: Payer: Self-pay

## 2017-09-08 ENCOUNTER — Ambulatory Visit: Payer: Medicare Other | Admitting: Family Medicine

## 2017-09-08 VITALS — BP 138/90 | HR 74 | Wt 194.8 lb

## 2017-09-08 DIAGNOSIS — E1169 Type 2 diabetes mellitus with other specified complication: Secondary | ICD-10-CM | POA: Diagnosis not present

## 2017-09-08 DIAGNOSIS — Z23 Encounter for immunization: Secondary | ICD-10-CM

## 2017-09-08 DIAGNOSIS — E1121 Type 2 diabetes mellitus with diabetic nephropathy: Secondary | ICD-10-CM | POA: Diagnosis not present

## 2017-09-08 LAB — POCT URINALYSIS DIP (PROADVANTAGE DEVICE)
BILIRUBIN UA: NEGATIVE
Blood, UA: NEGATIVE
Glucose, UA: NEGATIVE mg/dL
LEUKOCYTES UA: NEGATIVE
NITRITE UA: NEGATIVE
PH UA: 6 (ref 5.0–8.0)
Specific Gravity, Urine: 1.03
UUROB: 3.5

## 2017-09-08 LAB — POCT UA - MICROALBUMIN
Albumin/Creatinine Ratio, Urine, POC: 38.8
CREATININE, POC: 500 mg/dL
MICROALBUMIN (UR) POC: 7.8 mg/L

## 2017-09-08 LAB — POCT GLYCOSYLATED HEMOGLOBIN (HGB A1C): Hemoglobin A1C: 7.8

## 2017-09-08 NOTE — Patient Instructions (Signed)
Start checking your sugars 2 hours after some your meals

## 2017-09-08 NOTE — Progress Notes (Signed)
  Subjective:    Patient ID: Jacob Swanson, male    DOB: 12/31/1950, 67 y.o.   MRN: [id number]  Jacob Swanson is a 67 y.o. male who presents for follow-up of Type 2 diabetes mellitus.  Patient is checking home blood sugars.   Home blood sugar records 120-138 How often is blood sugars being checked: twice a day Current symptoms/problems include urination increase and little weight loss and have been since oct and now. Daily foot checks:yes  Any foot concerns: no  Last eye exam:last year having one this month Exercise:yes, three time a week The following portions of the patient's history were reviewed and updated as appropriate: allergies, current medications, past medical history, past social history and problem list.  ROS as in subjective above.     Objective:    Physical Exam Alert and in no distress otherwise not examined.   Lab Review Diabetic Labs Latest Ref Rng & Units 05/09/2017 01/03/2017 09/01/2016 05/03/2016 01/14/2016  HbA1c - 7.0% 8.3 8.2 7.6 7.4  Microalbumin mg/L - 15.0 - - 6.8  Micro/Creat Ratio - - 54.9 - - 4.4  Chol <200 mg/dL - 629(B241(H) - - 284(X210(H)  HDL >40 mg/dL - 57 - - 56  Calc LDL <324<100 mg/dL - 401(U167(H) - - 272(Z140(H)  Triglycerides <150 mg/dL - 85 - - 70  Creatinine 0.70 - 1.25 mg/dL - 3.66(Y1.39(H) - - 4.031.08   BP/Weight 05/11/2017 05/09/2017 01/03/2017 09/01/2016 05/03/2016  Systolic BP 142 120 120 130 120  Diastolic BP 80 80 80 84 70  Wt. (Lbs) 195.2 198.4 195.6 196.6 201  BMI 29.68 29.3 28.89 29.03 29.68   Foot/eye exam completion dates Latest Ref Rng & Units 05/04/2017 01/03/2017  Eye Exam No Retinopathy No Retinopathy -  Foot Form Completion - - Done  A1c is 7.8 A/C 38.8  Jacob Swanson  reports that  has never smoked. he has never used smokeless tobacco. He reports that he drinks alcohol. He reports that he does not use drugs.     Assessment & Plan:    Need for shingles vaccine - Plan: Varicella-zoster vaccine IM (Shingrix)  Type 2 diabetes mellitus with other specified  complication, unspecified whether long term insulin use (HCC) - Plan: POCT Urinalysis DIP (Proadvantage Device), POCT UA - Microalbumin, HgB A1c  Diabetic nephropathy associated with type 2 diabetes mellitus (HCC)   1. Rx changes: none  he would like to hold off on that.  This could easily have been because of the holiday season 2. Education: Reviewed 'ABCs' of diabetes management (respective goals in parentheses):  A1C (<7), blood pressure (<130/80), and cholesterol (LDL <100). 3. Compliance at present is estimated to be good. Efforts to improve compliance (if necessary) will be directed at increased exercise.  He will also try to work harder on his diet. 4. Follow up: 4 months I did mention nephropathy to him explained that he is presently on the appropriate medications to keep this under control.

## 2017-09-08 NOTE — Telephone Encounter (Signed)
Pt was called and informed that micro should little renal issues. He is on the right medicine per Dr. Susann GivensLalonde.No answer left pt a voice mail. Thanks [employer]

## 2017-09-22 LAB — HM DIABETES EYE EXAM

## 2017-12-05 ENCOUNTER — Other Ambulatory Visit: Payer: Self-pay | Admitting: Family Medicine

## 2017-12-06 ENCOUNTER — Other Ambulatory Visit: Payer: Self-pay | Admitting: Medical

## 2017-12-06 DIAGNOSIS — E118 Type 2 diabetes mellitus with unspecified complications: Secondary | ICD-10-CM

## 2017-12-31 ENCOUNTER — Other Ambulatory Visit: Payer: Self-pay | Admitting: Family Medicine

## 2017-12-31 DIAGNOSIS — E118 Type 2 diabetes mellitus with unspecified complications: Secondary | ICD-10-CM

## 2018-01-03 ENCOUNTER — Encounter: Payer: Self-pay | Admitting: Family Medicine

## 2018-01-03 ENCOUNTER — Ambulatory Visit: Payer: Medicare Other | Admitting: Family Medicine

## 2018-01-03 VITALS — BP 138/84 | HR 55 | Temp 97.5°F | Ht 68.0 in | Wt 192.0 lb

## 2018-01-03 DIAGNOSIS — E1159 Type 2 diabetes mellitus with other circulatory complications: Secondary | ICD-10-CM | POA: Diagnosis not present

## 2018-01-03 DIAGNOSIS — I152 Hypertension secondary to endocrine disorders: Secondary | ICD-10-CM

## 2018-01-03 DIAGNOSIS — E785 Hyperlipidemia, unspecified: Secondary | ICD-10-CM

## 2018-01-03 DIAGNOSIS — E1121 Type 2 diabetes mellitus with diabetic nephropathy: Secondary | ICD-10-CM | POA: Diagnosis not present

## 2018-01-03 DIAGNOSIS — E1169 Type 2 diabetes mellitus with other specified complication: Secondary | ICD-10-CM

## 2018-01-03 DIAGNOSIS — I10 Essential (primary) hypertension: Secondary | ICD-10-CM

## 2018-01-03 LAB — CBC WITH DIFFERENTIAL/PLATELET
BASOS: 0 %
Basophils Absolute: 0 10*3/uL (ref 0.0–0.2)
EOS (ABSOLUTE): 0.1 10*3/uL (ref 0.0–0.4)
EOS: 2 %
HEMATOCRIT: 38.8 % (ref 37.5–51.0)
HEMOGLOBIN: 12.5 g/dL — AB (ref 13.0–17.7)
IMMATURE GRANS (ABS): 0 10*3/uL (ref 0.0–0.1)
IMMATURE GRANULOCYTES: 0 %
LYMPHS: 41 %
Lymphocytes Absolute: 2.2 10*3/uL (ref 0.7–3.1)
MCH: 30 pg (ref 26.6–33.0)
MCHC: 32.2 g/dL (ref 31.5–35.7)
MCV: 93 fL (ref 79–97)
MONOCYTES: 7 %
Monocytes Absolute: 0.4 10*3/uL (ref 0.1–0.9)
NEUTROS ABS: 2.7 10*3/uL (ref 1.4–7.0)
NEUTROS PCT: 50 %
PLATELETS: 265 10*3/uL (ref 150–379)
RBC: 4.16 x10E6/uL (ref 4.14–5.80)
RDW: 13.9 % (ref 12.3–15.4)
WBC: 5.4 10*3/uL (ref 3.4–10.8)

## 2018-01-03 LAB — COMPREHENSIVE METABOLIC PANEL
A/G RATIO: 1.8 (ref 1.2–2.2)
ALBUMIN: 4.6 g/dL (ref 3.6–4.8)
ALT: 15 IU/L (ref 0–44)
AST: 15 IU/L (ref 0–40)
Alkaline Phosphatase: 70 IU/L (ref 39–117)
BILIRUBIN TOTAL: 0.6 mg/dL (ref 0.0–1.2)
BUN/Creatinine Ratio: 14 (ref 10–24)
BUN: 16 mg/dL (ref 8–27)
CALCIUM: 9.8 mg/dL (ref 8.6–10.2)
CO2: 24 mmol/L (ref 20–29)
CREATININE: 1.16 mg/dL (ref 0.76–1.27)
Chloride: 104 mmol/L (ref 96–106)
GFR calc Af Amer: 75 mL/min/{1.73_m2} (ref >59)
GFR, EST NON AFRICAN AMERICAN: 65 mL/min/{1.73_m2} (ref >59)
GLOBULIN, TOTAL: 2.6 g/dL (ref 1.5–4.5)
Glucose: 128 mg/dL — ABNORMAL HIGH (ref 65–99)
Potassium: 4.5 mmol/L (ref 3.5–5.2)
SODIUM: 140 mmol/L (ref 134–144)
TOTAL PROTEIN: 7.2 g/dL (ref 6.0–8.5)

## 2018-01-03 LAB — LIPID PANEL
CHOL/HDL RATIO: 3.5 ratio (ref 0.0–5.0)
CHOLESTEROL TOTAL: 195 mg/dL (ref 100–199)
HDL: 55 mg/dL (ref >39)
LDL CALC: 127 mg/dL — AB (ref 0–99)
TRIGLYCERIDES: 67 mg/dL (ref 0–149)
VLDL CHOLESTEROL CAL: 13 mg/dL (ref 5–40)

## 2018-01-03 LAB — POCT GLYCOSYLATED HEMOGLOBIN (HGB A1C): Hemoglobin A1C: 6.4

## 2018-01-03 NOTE — Progress Notes (Signed)
  Subjective:    Patient ID: Jacob Swanson, male    DOB: 1951-03-03, 67 y.o.   MRN: [id number]  Jacob Swanson is a 67 y.o. male who presents for follow-up of Type 2 diabetes mellitus.  Patient is checking home blood sugars.   Home blood sugar records:120-150 How often is blood sugars being checked: bid Current symptoms/problems include none and have been unchanged. Daily foot checks: yes   Any foot concerns: no Last eye exam: jan 2019 Exercise: sliver sneakers walking weights He continues on Metformin as well as olmesartan/amlodipine.  Crestor is causing no difficulties as well as Zetia.  He is also on OTC meds and having no difficulty with them.  Continues to work. The following portions of the patient's history were reviewed and updated as appropriate: allergies, current medications, past medical history, past social history and problem list.  ROS as in subjective above.     Objective:    Physical Exam Alert and in no distress foot exam normal   Lab Review Diabetic Labs Latest Ref Rng & Units 09/08/2017 05/09/2017 01/03/2017 09/01/2016 05/03/2016  HbA1c - 7.8 7.0% 8.3 8.2 7.6  Microalbumin mg/L 7.8 - 15.0 - -  Micro/Creat Ratio - 38.8 - 54.9 - -  Chol <200 mg/dL - - 409(W) - -  HDL >11 mg/dL - - 57 - -  Calc LDL <914 mg/dL - - 782(N) - -  Triglycerides <150 mg/dL - - 85 - -  Creatinine 0.70 - 1.25 mg/dL - - 5.62(Z) - -   BP/Weight 09/08/2017 05/11/2017 05/09/2017 01/03/2017 09/01/2016  Systolic BP 138 142 120 120 130  Diastolic BP 90 80 80 80 84  Wt. (Lbs) 194.8 195.2 198.4 195.6 196.6  BMI 29.62 29.68 29.3 28.89 29.03   Foot/eye exam completion dates Latest Ref Rng & Units 09/22/2017 09/22/2017  Eye Exam No Retinopathy No Retinopathy Retinopathy(A)  Foot Form Completion - - -  A1c is 6.4  Jacob Swanson  reports that he has never smoked. He has never used smokeless tobacco. He reports that he drinks alcohol. He reports that he does not use drugs.     Assessment & Plan:    Type 2 diabetes  mellitus with other specified complication, unspecified whether long term insulin use (HCC) - Plan: CBC with Differential/Platelet, Comprehensive metabolic panel, Lipid panel, POCT UA - Microalbumin  Diabetic nephropathy associated with type 2 diabetes mellitus (HCC) - Plan: Comprehensive metabolic panel  Hyperlipidemia associated with type 2 diabetes mellitus (HCC) - Plan: Lipid panel  Hypertension associated with diabetes (HCC) - Plan: CBC with Differential/Platelet, Comprehensive metabolic panel   1. Rx changes: none 2. Education: Reviewed 'ABCs' of diabetes management (respective goals in parentheses):  A1C (<7), blood pressure (<130/80), and cholesterol (LDL <100). 3. Compliance at present is estimated to be excellent. Efforts to improve compliance (if necessary) will be directed at Continuing present medication regimen. 4. Follow up: 4 months His A1c is under much better control and probably the main reason it was not from the past is that he just went through the holidays and admits to dietary indiscretion.

## 2018-03-05 ENCOUNTER — Other Ambulatory Visit: Payer: Self-pay | Admitting: Family Medicine

## 2018-03-05 DIAGNOSIS — E118 Type 2 diabetes mellitus with unspecified complications: Secondary | ICD-10-CM

## 2018-03-16 ENCOUNTER — Encounter: Payer: Self-pay | Admitting: *Deleted

## 2018-05-08 ENCOUNTER — Encounter: Payer: Self-pay | Admitting: Family Medicine

## 2018-05-08 ENCOUNTER — Ambulatory Visit: Payer: Medicare Other | Admitting: Family Medicine

## 2018-05-08 VITALS — BP 142/88 | HR 66 | Temp 98.1°F | Ht 69.0 in | Wt 186.8 lb

## 2018-05-08 DIAGNOSIS — N529 Male erectile dysfunction, unspecified: Secondary | ICD-10-CM | POA: Diagnosis not present

## 2018-05-08 DIAGNOSIS — E1121 Type 2 diabetes mellitus with diabetic nephropathy: Secondary | ICD-10-CM

## 2018-05-08 DIAGNOSIS — E118 Type 2 diabetes mellitus with unspecified complications: Secondary | ICD-10-CM

## 2018-05-08 DIAGNOSIS — I152 Hypertension secondary to endocrine disorders: Secondary | ICD-10-CM

## 2018-05-08 DIAGNOSIS — I10 Essential (primary) hypertension: Secondary | ICD-10-CM

## 2018-05-08 DIAGNOSIS — E785 Hyperlipidemia, unspecified: Secondary | ICD-10-CM

## 2018-05-08 DIAGNOSIS — E1169 Type 2 diabetes mellitus with other specified complication: Secondary | ICD-10-CM | POA: Diagnosis not present

## 2018-05-08 DIAGNOSIS — E1159 Type 2 diabetes mellitus with other circulatory complications: Secondary | ICD-10-CM | POA: Diagnosis not present

## 2018-05-08 LAB — POCT GLYCOSYLATED HEMOGLOBIN (HGB A1C): HEMOGLOBIN A1C: 7 % — AB (ref 4.0–5.6)

## 2018-05-08 NOTE — Progress Notes (Signed)
  Subjective:    Patient ID: Jacob Swanson, male    DOB: 01/20/1951, 67 y.o.   MRN: [id number]  Jacob Swanson is a 67 y.o. male who presents for follow-up of Type 2 diabetes mellitus.  Patient is  checking home blood sugars.   Home blood sugar records: meter record How often is blood sugars being checked: bid Current symptoms/problems include none and have been unchanged. Daily foot checks: yes  Any foot concerns: no  Last eye exam: 2019 Exercise: work out in [employer] five days a week He continues on Metformin and is having no difficulty with that.  He is also taking Crestor and Zetia.  Occasionally he will use Valtrex.  His Cialis is working well.  He continues on his blood pressure meds without difficulty. The following portions of the patient's history were reviewed and updated as appropriate: allergies, current medications, past medical history, past social history and problem list.  ROS as in subjective above.     Objective:    Physical Exam Alert and in no distress otherwise not examined.  Height 5\' 9"  (1.753 m), weight 186 lb 12.8 oz (84.7 kg).  Lab Review Diabetic Labs Latest Ref Rng & Units 01/03/2018 09/08/2017 05/09/2017 01/03/2017 09/01/2016  HbA1c - 6.4 7.8 7.0% 8.3 8.2  Microalbumin mg/L - 7.8 - 15.0 -  Micro/Creat Ratio - - 38.8 - 54.9 -  Chol 100 - 199 mg/dL 253195 - - 664(Q241(H) -  HDL >03>39 mg/dL 55 - - 57 -  Calc LDL 0 - 99 mg/dL 474(Q127(H) - - 595(G167(H) -  Triglycerides 0 - 149 mg/dL 67 - - 85 -  Creatinine 0.76 - 1.27 mg/dL 3.871.16 - - 5.64(P1.39(H) -   BP/Weight 05/08/2018 01/03/2018 09/08/2017 05/11/2017 05/09/2017  Systolic BP - 138 138 142 120  Diastolic BP - 84 90 80 80  Wt. (Lbs) 186.8 192 194.8 195.2 198.4  BMI 27.59 29.19 29.62 29.68 29.3   Foot/eye exam completion dates Latest Ref Rng & Units 01/03/2018 09/22/2017  Eye Exam No Retinopathy - No Retinopathy  Foot Form Completion - Done -  A1c is 7.0  Jacob Swanson  reports that he has never smoked. He has never used smokeless tobacco. He  reports that he drinks alcohol. He reports that he does not use drugs.     Assessment & Plan:    Type 2 diabetes mellitus with other specified complication, unspecified whether long term insulin use (HCC)  Diabetic nephropathy associated with type 2 diabetes mellitus (HCC)  Hyperlipidemia associated with type 2 diabetes mellitus (HCC)  Hypertension associated with diabetes (HCC)  Erectile dysfunction, unspecified erectile dysfunction type  Type 2 diabetes mellitus with complication, without long-term current use of insulin (HCC)    1. Rx changes: none 2. Education: Reviewed 'ABCs' of diabetes management (respective goals in parentheses):  A1C (<7), blood pressure (<130/80), and cholesterol (LDL <100). 3. Compliance at present is estimated to be good. Efforts to improve compliance (if necessary) will be directed at No change. 4. Follow up: 4 months In general he does a very good job taking care of himself.  Did discuss the fact that with time more more medication will be needed.

## 2018-06-01 ENCOUNTER — Other Ambulatory Visit: Payer: Self-pay | Admitting: Family Medicine

## 2018-06-01 DIAGNOSIS — E118 Type 2 diabetes mellitus with unspecified complications: Secondary | ICD-10-CM

## 2018-08-03 ENCOUNTER — Telehealth: Payer: Self-pay | Admitting: Internal Medicine

## 2018-08-03 NOTE — Telephone Encounter (Signed)
Called to find out if pt is taking medicine for cholesterol.  Got a notice that he has not refilled it

## 2018-08-07 ENCOUNTER — Other Ambulatory Visit: Payer: Self-pay | Admitting: Family Medicine

## 2018-08-07 DIAGNOSIS — E118 Type 2 diabetes mellitus with unspecified complications: Secondary | ICD-10-CM

## 2018-08-13 IMAGING — US US AORTA SCREENING (MEDICARE)
1 series · 14 of 25 positions shown · non-contrast
Comparison: Abdominal aortic ultrasound - 05/17/2016

CLINICAL DATA: Male between 65-75 years of age with a smoking
history.

EXAM:
US ABDOMINAL AORTA MEDICARE SCREENING
TECHNIQUE: Ultrasound examination of the abdominal aorta was performed as a
screening evaluation for abdominal aortic aneurysm.

[Series 1: us aorta screening (medicare) · 0.23mm/px · 14 of 25 slices shown]
[im 1/25]
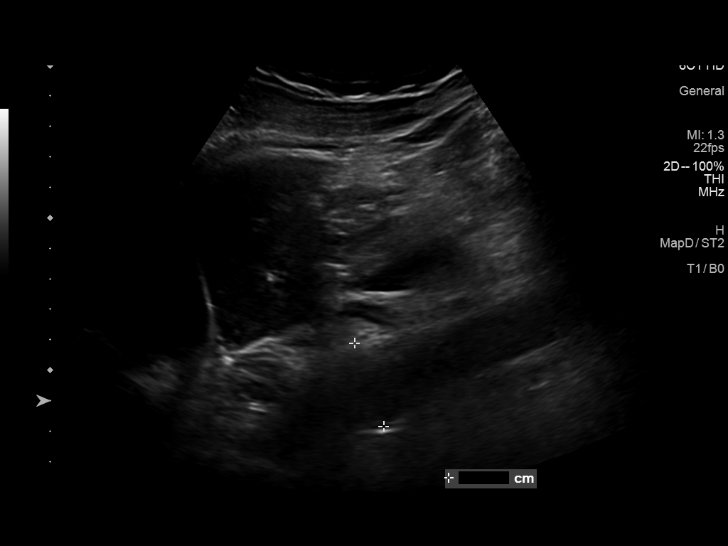
[im 3/25]
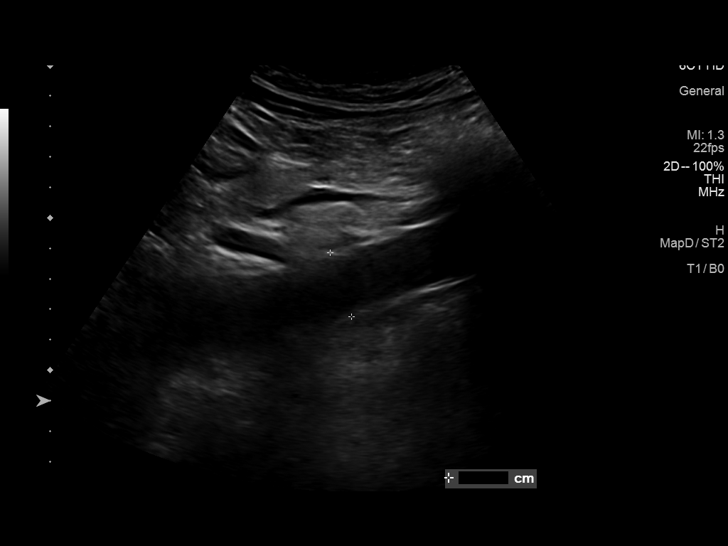
[im 5/25]
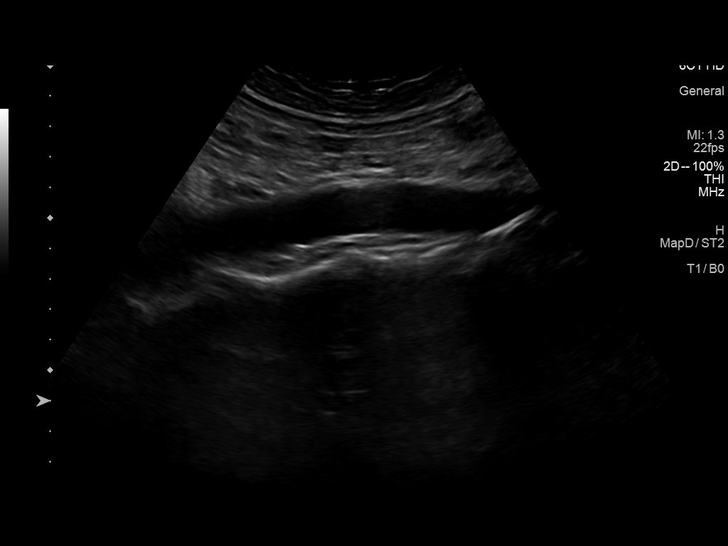
[im 7/25]
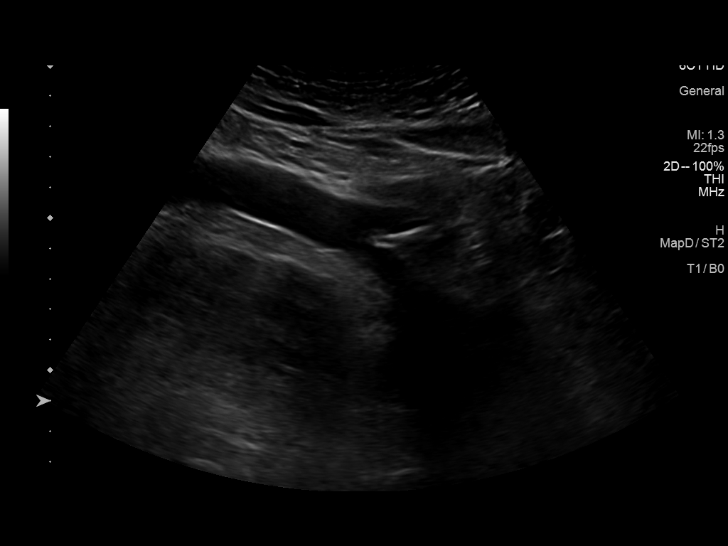
[im 9/25]
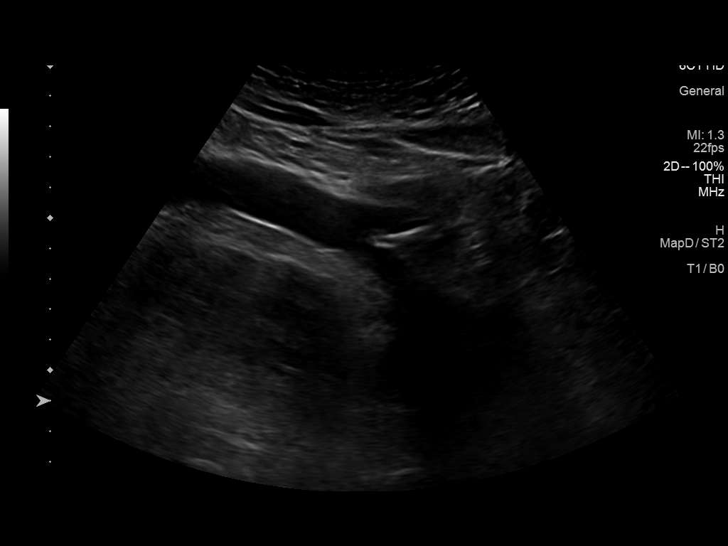
[im 10/25]
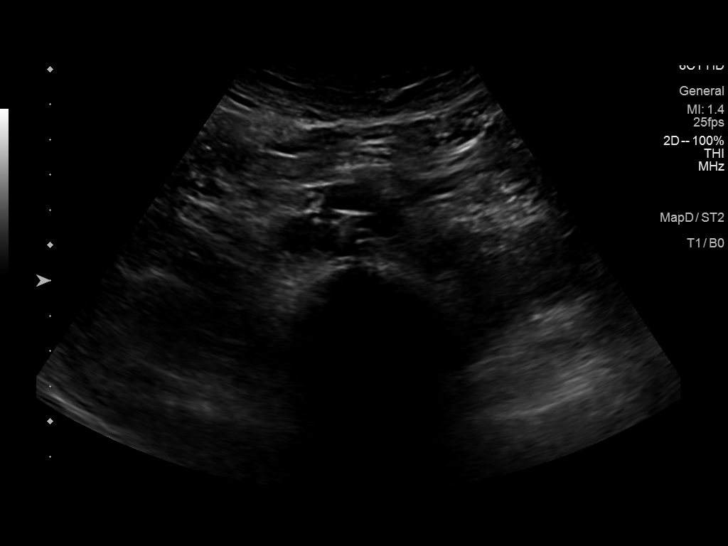
[im 12/25]
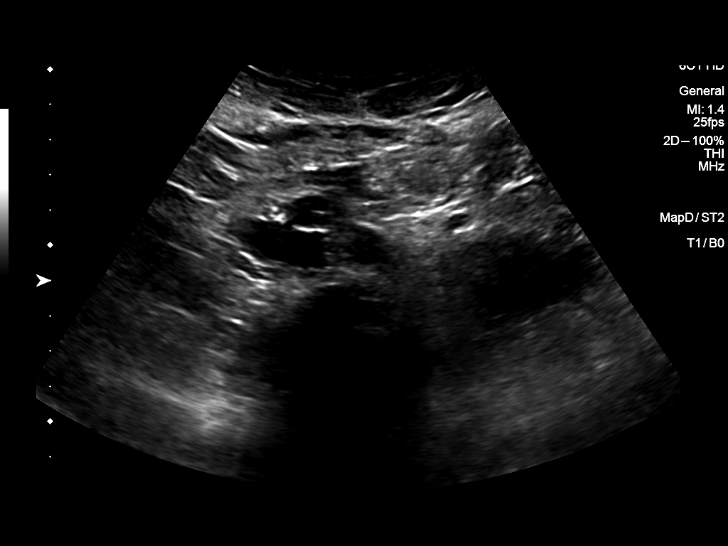
[im 14/25]
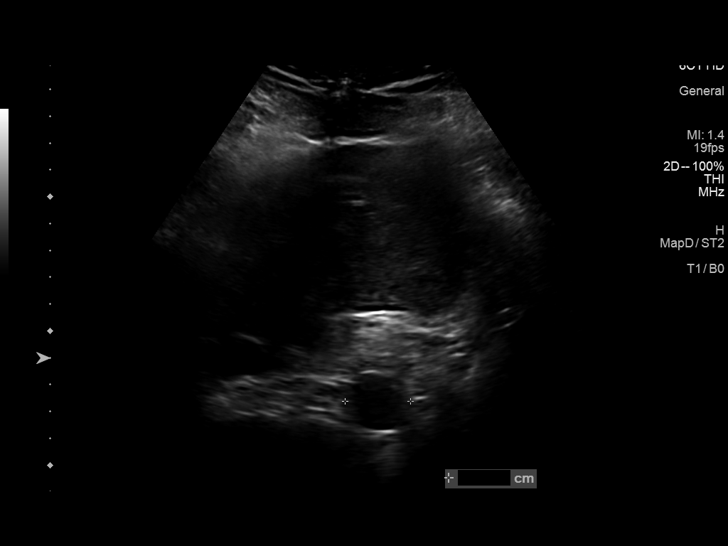
[im 16/25]
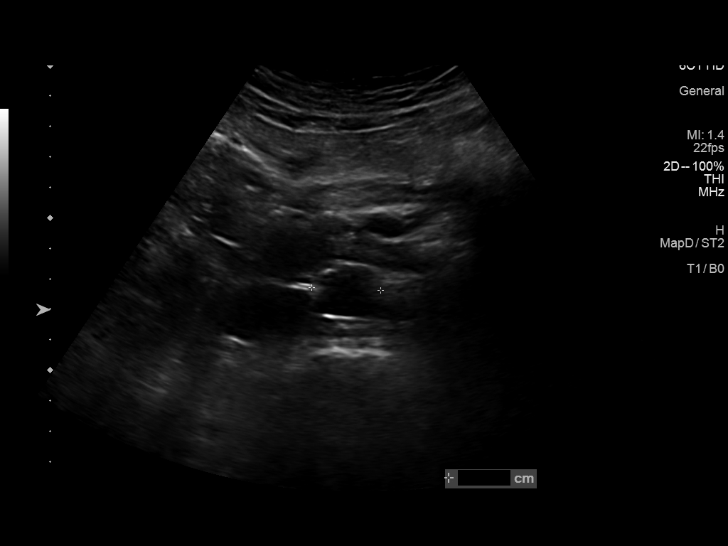
[im 17/25]
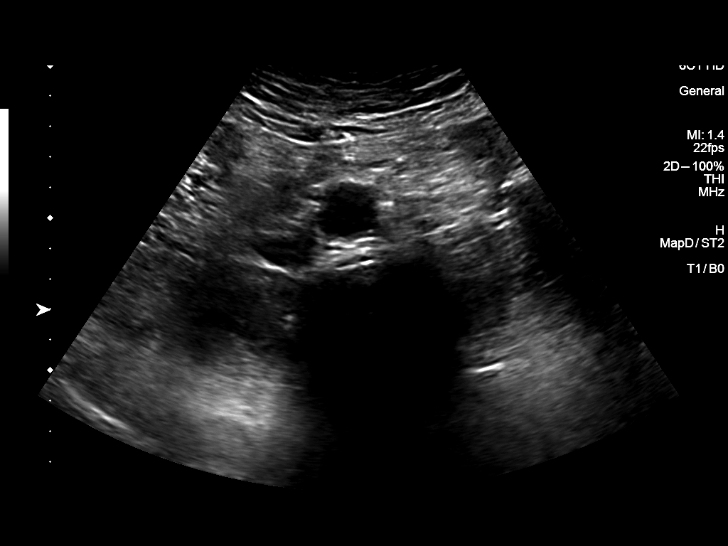
[im 19/25]
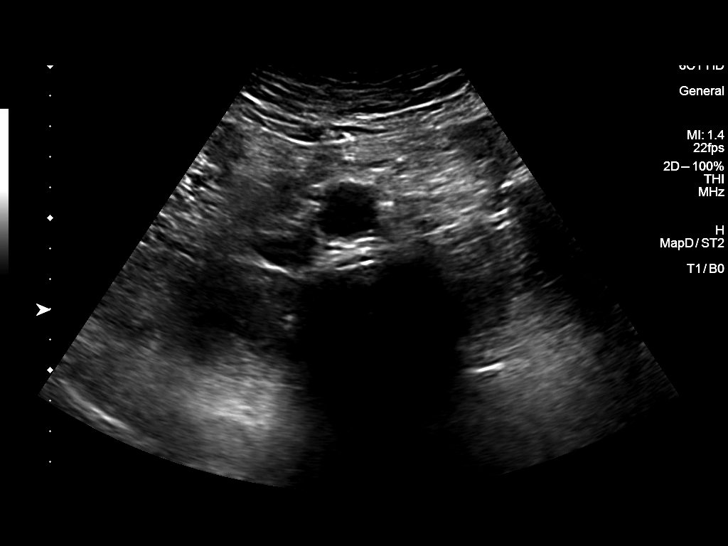
[im 21/25]
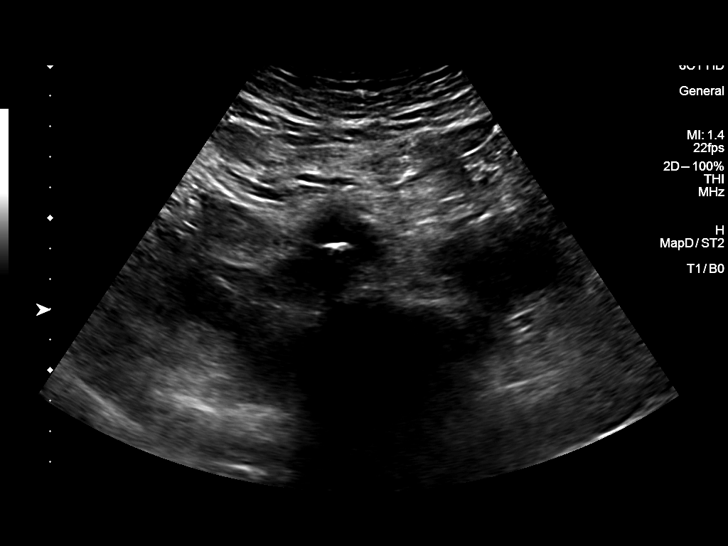
[im 23/25]
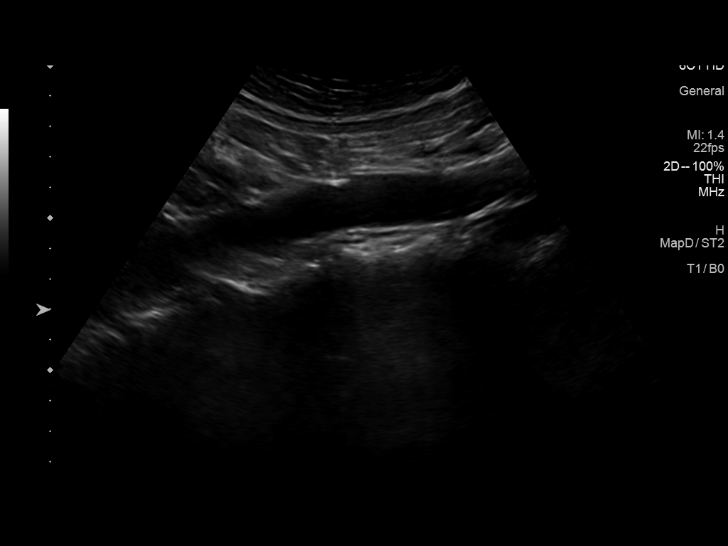
[im 25/25]
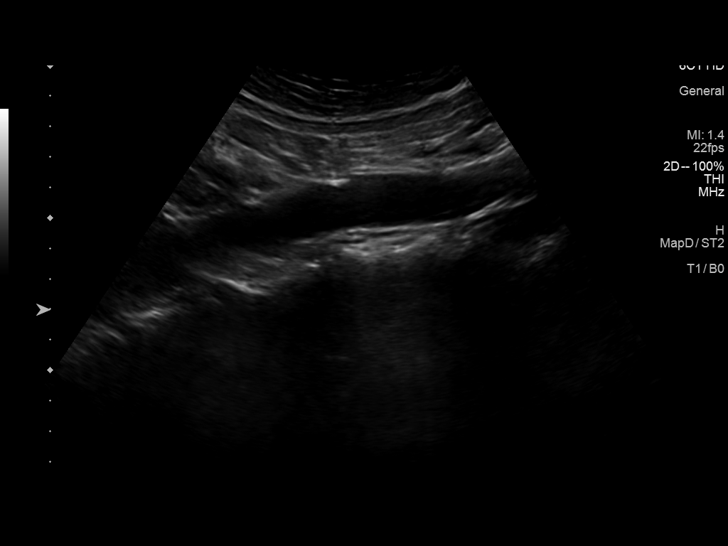

[14 of 25 positions shown; findings below may reference images not displayed]

FINDINGS: Abdominal aortic measurements as follows:

Proximal:  2.9 cm

Mid:  2.3 cm

Distal:  2.3 cm

There is a minimal amount of atherosclerotic plaque within the
distal aspect of the abdominal aorta.
IMPRESSION: 1. No evidence of abdominal aortic aneurysm.
2.  Aortic Atherosclerosis ([id number].H).

## 2018-08-17 ENCOUNTER — Encounter: Payer: Self-pay | Admitting: Family Medicine

## 2018-08-17 ENCOUNTER — Ambulatory Visit: Payer: Medicare Other | Admitting: Family Medicine

## 2018-08-17 VITALS — BP 120/70 | HR 72 | Ht 68.0 in | Wt 187.8 lb

## 2018-08-17 DIAGNOSIS — M25521 Pain in right elbow: Secondary | ICD-10-CM

## 2018-08-17 DIAGNOSIS — M25561 Pain in right knee: Secondary | ICD-10-CM | POA: Diagnosis not present

## 2018-08-17 DIAGNOSIS — M25551 Pain in right hip: Secondary | ICD-10-CM | POA: Diagnosis not present

## 2018-08-17 NOTE — Patient Instructions (Signed)
   Ice the painful areas (right elbow, lateral hip and knee) Use tylenol if needed for discomfort. Expect that some bruising may develop. You may keep active (moving these joints)--listen to your body, and avoid activities which may cause more pain. I don't see that you're having any bony pain, so don't think anything is broken or need x-rays. If things change, or get worse, or new symptoms develop, feel free to return for re-evaluation.

## 2018-08-17 NOTE — Progress Notes (Signed)
Chief Complaint  Patient presents with  . Fall    [place] (did fill out incident report ot P.O.) Right elbow, knee and hip are the bothersome areas. Elbow hurting the most, knee starting to feel better except for right outer side still bothering him and hip being the least pain.     Earlier today he slipped and fell--fell onto his right elbow, right hip and knee.  He had trouble bearing weight on the knee when he first stood up, but that got better, and has been able to bear weight and walk normally since.  Denies bruising, swelling. Notes a little residual discomfort at his elbow an forearm, and his lateral knee. No head injury.  PMH, PSH, SH reviewed He takes aspirin, no other blood thinners  Outpatient Encounter Medications as of 08/17/2018  Medication Sig  . ACCU-CHEK AVIVA PLUS test strip TEST AS DIRECTED  . aspirin 81 MG tablet Take 81 mg by mouth daily.    Marland Kitchen. co-enzyme Q-10 50 MG capsule Take 50 mg by mouth daily.    Marland Kitchen. ezetimibe (ZETIA) 10 MG tablet Take 1 tablet (10 mg total) by mouth daily.  . Ginkgo Biloba 120 MG CAPS Take 1 capsule by mouth daily.    . metFORMIN (GLUCOPHAGE) 850 MG tablet TAKE 1 TABLET BY MOUTH TWICE A DAY WITH MEALS  . Multiple Vitamins-Minerals (MULTIVITAMIN WITH MINERALS) tablet Take 1 tablet by mouth daily.  . Olmesartan-amLODIPine-HCTZ 20-5-12.5 MG TABS TAKE 1 TABLET EVERY DAY  . rosuvastatin (CRESTOR) 40 MG tablet Take 1 tablet (40 mg total) by mouth daily.  . tadalafil (CIALIS) 20 MG tablet Take 1 tablet (20 mg total) by mouth daily as needed for erectile dysfunction. (Patient not taking: Reported on 08/17/2018)  . valACYclovir (VALTREX) 1000 MG tablet TAKE 1 TABLET (1,000 MG TOTAL) BY MOUTH 2 (TWO) TIMES DAILY. (Patient not taking: Reported on 08/17/2018)   No facility-administered encounter medications on file as of 08/17/2018.    No Known Allergies  ROS: no fever, chills, URI symptoms, chest pain,  shortness of breath. No numbness, tingling, GI complaints or other concerns except as noted in HPI  PHYSICAL EXAM: BP 120/70   Pulse 72   Ht 5\' 8"  (1.727 m)   Wt 187 lb 12.8 oz (85.2 kg)   BMI 28.55 kg/m   Well-appearing, pleasant male in no distress R elbow: No bony tenderness, soft tissue swelling, effusion. FROM. Some discomfort with pronation against resistance, and wrist extension against resistance, but full strength Some slight tingling in his 5th finger when palpating over the ulnar nerve, but no tenderness anywhere Some discomfort with triceps strength testing, but intact strength. No swelling/effusion/warmth  Right knee--mild tenderness laterally (at right lateral joint line). FROM, without pain. Also has some mild tenderness above the patella, in lower quads. No effusion, warmth, bruising  Right hip: FROM without pain.  Area of discomfort is just posterior to the greater trochanter. No bony tenderness. nontender over bursa. No swelling, bruising.   ASSESSMENT/PLAN:   Right elbow pain - suspect mild contusion. No bony tenderness to warrant xray at this time  Right hip pain - lateral pain, suspect related to mild contusion from fall. Reassured  Acute pain of right knee - no e/o internal derangement on exam, no focal bony tenderness.  f/u if worsening/persistence of pain  Date of Injury--today.   Ice the painful areas (right elbow, lateral hip and knee) Use tylenol if needed for discomfort.  Expect that some bruising may develop. You may keep active (moving these joints)--listen to your body, and avoid activities which may cause more pain. I don't see that you're having any bony pain, so don't think anything is broken or need x-rays. If things change, or get worse, or new symptoms develop, feel free to return for re-evaluation.

## 2018-09-04 ENCOUNTER — Other Ambulatory Visit: Payer: Self-pay | Admitting: Family Medicine

## 2018-09-04 DIAGNOSIS — E118 Type 2 diabetes mellitus with unspecified complications: Secondary | ICD-10-CM

## 2018-09-05 ENCOUNTER — Other Ambulatory Visit: Payer: Self-pay | Admitting: Family Medicine

## 2018-09-07 ENCOUNTER — Encounter: Payer: Self-pay | Admitting: Family Medicine

## 2018-09-07 ENCOUNTER — Ambulatory Visit: Payer: Medicare Other | Admitting: Family Medicine

## 2018-09-07 VITALS — BP 158/96 | HR 68 | Temp 98.0°F | Ht 68.0 in | Wt 193.0 lb

## 2018-09-07 DIAGNOSIS — Z8619 Personal history of other infectious and parasitic diseases: Secondary | ICD-10-CM

## 2018-09-07 DIAGNOSIS — R531 Weakness: Secondary | ICD-10-CM | POA: Diagnosis not present

## 2018-09-07 DIAGNOSIS — E1159 Type 2 diabetes mellitus with other circulatory complications: Secondary | ICD-10-CM

## 2018-09-07 DIAGNOSIS — Z125 Encounter for screening for malignant neoplasm of prostate: Secondary | ICD-10-CM | POA: Diagnosis not present

## 2018-09-07 DIAGNOSIS — E118 Type 2 diabetes mellitus with unspecified complications: Secondary | ICD-10-CM | POA: Diagnosis not present

## 2018-09-07 DIAGNOSIS — E1121 Type 2 diabetes mellitus with diabetic nephropathy: Secondary | ICD-10-CM

## 2018-09-07 DIAGNOSIS — G4730 Sleep apnea, unspecified: Secondary | ICD-10-CM

## 2018-09-07 DIAGNOSIS — N529 Male erectile dysfunction, unspecified: Secondary | ICD-10-CM

## 2018-09-07 DIAGNOSIS — Z0000 Encounter for general adult medical examination without abnormal findings: Secondary | ICD-10-CM | POA: Diagnosis not present

## 2018-09-07 DIAGNOSIS — E785 Hyperlipidemia, unspecified: Secondary | ICD-10-CM

## 2018-09-07 DIAGNOSIS — E1169 Type 2 diabetes mellitus with other specified complication: Secondary | ICD-10-CM

## 2018-09-07 DIAGNOSIS — I10 Essential (primary) hypertension: Secondary | ICD-10-CM

## 2018-09-07 DIAGNOSIS — I152 Hypertension secondary to endocrine disorders: Secondary | ICD-10-CM

## 2018-09-07 LAB — POCT GLYCOSYLATED HEMOGLOBIN (HGB A1C): Hemoglobin A1C: 6.7 % — AB (ref 4.0–5.6)

## 2018-09-07 NOTE — Addendum Note (Signed)
Addended by: Renelda LomaHENRY, Cornellius Kropp on: 09/07/2018 12:23 PM   Modules accepted: Orders

## 2018-09-07 NOTE — Progress Notes (Signed)
Jacob Swanson is a 68 y.o. male who presents for annual wellness visit,CPE and follow-up on chronic medical conditions.  He has the following concerns: He still does have difficulty with erectile dysfunction.  He has tried Viagra and Cialis and finds it to be useful but not as much as he would like.  He does note a slight decrease in his overall strength.  He does workout regularly.  He would like to do further evaluation and treatment of that.  He does have underlying OSA and is using CPAP.  He has not gotten the readout recently.  He exercises regularly.  He checks his blood sugars regularly.  He has had an eye exam this month.  He does check his feet regularly.  Drinking is not an issue with him.  He does not smoke.  Does check his blood sugars regularly.  Presently he is taking metformin as well as olmesartan/amlodipine, Crestor and Zetia.  He does occasionally use Valtrex for his herpes labialis.  He is now retired.  He is helping take care of his father.   Immunizations and Health Maintenance Immunization History  Administered Date(s) Administered  . Influenza Whole 07/07/2009, 04/21/2011  . Influenza, High Dose Seasonal PF 05/09/2017  . Influenza, Seasonal, Injecte, Preservative Fre 08/08/2012  . Influenza,inj,Quad PF,6+ Mos 08/10/2013, 04/14/2015  . Influenza-Unspecified 04/23/2014, 05/12/2016  . Pneumococcal Conjugate-13 09/10/2014  . Pneumococcal Polysaccharide-23 05/19/2005  . Td 04/22/2003  . Tdap 08/08/2012  . Zoster 07/13/2011  . Zoster Recombinat (Shingrix) 05/11/2017, 09/08/2017   There are no preventive care reminders to display for this patient.  Last colonoscopy: 05/22/17 cologuard Last PSA: Today Dentist: Q six months Ophtho:08/24/18 Exercise: 5 times a week gym  Other doctors caring for patient include: Dr. Elmer Swanson eye   Advanced Directives: No.  Information given. Does Patient Have a Medical Advance Directive?: No Would patient like information on creating a medical  advance directive?: Yes (MAU/Ambulatory/Procedural Areas - Information given)  Depression screen:  See questionnaire below.     Dep[facility] screen [facility] 09/07/2018 05/11/2017 01/14/2016 09/10/2014 08/10/2013  Decreased Interest 0 0 0 0 0  Down, Depressed, Hopeless 0 0 0 0 0  PHQ - 2 Score 0 0 0 0 0    Fall Screen: See Questionaire below.   Fall Risk  09/07/2018 05/11/2017 01/14/2016 09/10/2014 08/10/2013  Falls in the past year? 0 No No No No    ADL screen:  See questionnaire below.  Functional Status Survey: Is the patient deaf or have difficulty hearing?: No Does the patient have difficulty seeing, even when wearing glasses/contacts?: No Does the patient have difficulty concentrating, remembering, or making decisions?: No Does the patient have difficulty walking or climbing stairs?: No Does the patient have difficulty dressing or bathing?: No Does the patient have difficulty doing errands alone such as visiting a doctor's office or shopping?: No   Review of Systems  Constitutional: -, -unexpected weight change, -anorexia, -fatigue Allergy: -sneezing, -itching, -congestion Dermatology: denies changing moles, rash, lumps ENT: -runny nose, -ear pain, -sore throat,  Cardiology:  -chest pain, -palpitations, -orthopnea, Respiratory: -cough, -shortness of breath, -dyspnea on exertion, -wheezing,  Gastroenterology: -abdominal pain, -nausea, -vomiting, -diarrhea, -constipation, -dysphagia Hematology: -bleeding or bruising problems Musculoskeletal: -arthralgias, -myalgias, -joint swelling, -back pain, - Ophthalmology: -vision changes,  Urology: -dysuria, -difficulty urinating,  -urinary frequency, -urgency, incontinence Neurology: -, -numbness, , -memory loss, -falls, -dizziness    PHYSICAL EXAM:  BP (!) 158/96 (BP Location: Left Arm, Patient Position: Sitting)   Pulse 68  Temp 98 F (36.7 C)   Ht 5\' 8"  (1.727 m)   Wt 193 lb (87.5 kg)   SpO2 98%   BMI 29.35 kg/m   General  Appearance: Alert, cooperative, no distress, appears stated age Head: Normocephalic, without obvious abnormality, atraumatic Eyes: PERRL, conjunctiva/corneas clear, EOM's intact, fundi benign Ears: Normal TM's and external ear canals Nose: Nares normal, mucosa normal, no drainage or sinus   tenderness Throat: Lips, mucosa, and tongue normal; teeth and gums normal Neck: Supple, no lymphadenopathy, thyroid:no enlargement/tenderness/nodules; no carotid bruit or JVD Lungs: Clear to auscultation bilaterally without wheezes, rales or ronchi; respirations unlabored Heart: Regular rate and rhythm, S1 and S2 normal, no murmur, rub or gallop Abdomen: Soft, non-tender, nondistended, normoactive bowel sounds, no masses, no hepatosplenomegaly Extremities: No clubbing, cyanosis or edema Pulses: 2+ and symmetric all extremities Skin: Skin color, texture, turgor normal, no rashes or lesions Lymph nodes: Cervical, supraclavicular, and axillary nodes normal Neurologic: CNII-XII intact, normal strength, sensation and gait; reflexes 2+ and symmetric throughout   Psych: Normal mood, affect, hygiene and grooming A1c is 6.7 ASSESSMENT/PLAN: Routine general medical examination at a health care facility  Screening for prostate cancer - Plan: PSA  Sleep apnea, unspecified type  Decreased strength - Plan: Testosterone  Erectile dysfunction, unspecified erectile dysfunction type - Plan: Ambulatory referral to Urology  Diabetic nephropathy associated with type 2 diabetes mellitus (HCC)  H/O herpes labialis  Hyperlipidemia associated with type 2 diabetes mellitus (HCC)  Hypertension associated with diabetes (HCC)  Type 2 diabetes mellitus with complications (HCC) He is interested in pursuing the erectile dysfunction especially since he has not has much success with Viagra and Cialis and I will therefore refer to urology. He will continue on his diabetes medicines as well as cholesterol and blood pressure  meds.  Discussed PSA screening (risks/benefits), recommended at least 30 minutes of aerobic activity at least 5 days/week;healthy diet and alcohol recommendations (less than or equal to 2 drinks/day) reviewed;  Immunization recommendations discussed.  Colonoscopy recommendations reviewed.   Medicare Attestation I have personally reviewed: The patient's medical and social history Their use of alcohol, tobacco or illicit drugs Their current medications and supplements The patient's functional ability including ADLs,fall risks, home safety risks, cognitive, and hearing and visual impairment Diet and physical activities Evidence for depression or mood disorders  The patient's weight, height, and BMI have been recorded in the chart.  I have made referrals, counseling, and provided education to the patient based on review of the above and I have provided the patient with a written personalized care plan for preventive services.     Sharlot Gowda, MD   09/07/2018

## 2018-09-07 NOTE — Patient Instructions (Signed)
  Mr. Downie , Thank you for taking time to come for your Medicare Wellness Visit. I appreciate your ongoing commitment to your health goals. Please review the following plan we discussed and let me know if I can assist you in the future.   These are the goals we discussed: Goals   None     This is a list of the screening recommended for you and due dates:  Health Maintenance  Topic Date Due  . Eye exam for diabetics  09/22/2018  . Hemoglobin A1C  11/06/2018  . Complete foot exam   01/04/2019  . Cologuard (Stool DNA test)  05/22/2020  . Tetanus Vaccine  08/08/2022  . Flu Shot  Completed  .  Hepatitis C: One time screening is recommended by Center for Disease Control  (CDC) for  adults born from [birth year] through 1965.   Completed  . Pneumonia vaccines  Discontinued

## 2018-09-08 LAB — TESTOSTERONE: Testosterone: 345 ng/dL (ref 264–916)

## 2018-09-08 LAB — PSA: Prostate Specific Ag, Serum: 1.9 ng/mL (ref 0.0–4.0)

## 2018-11-27 ENCOUNTER — Other Ambulatory Visit: Payer: Self-pay | Admitting: Family Medicine

## 2018-11-27 DIAGNOSIS — E118 Type 2 diabetes mellitus with unspecified complications: Secondary | ICD-10-CM

## 2019-01-10 ENCOUNTER — Other Ambulatory Visit: Payer: Self-pay

## 2019-01-10 ENCOUNTER — Ambulatory Visit: Payer: Medicare Other | Admitting: Family Medicine

## 2019-01-10 ENCOUNTER — Encounter: Payer: Self-pay | Admitting: Family Medicine

## 2019-01-10 VITALS — BP 132/80 | HR 84 | Temp 96.2°F | Wt 184.0 lb

## 2019-01-10 DIAGNOSIS — I10 Essential (primary) hypertension: Secondary | ICD-10-CM

## 2019-01-10 DIAGNOSIS — G4730 Sleep apnea, unspecified: Secondary | ICD-10-CM | POA: Diagnosis not present

## 2019-01-10 DIAGNOSIS — E1169 Type 2 diabetes mellitus with other specified complication: Secondary | ICD-10-CM | POA: Diagnosis not present

## 2019-01-10 DIAGNOSIS — E118 Type 2 diabetes mellitus with unspecified complications: Secondary | ICD-10-CM | POA: Diagnosis not present

## 2019-01-10 DIAGNOSIS — E785 Hyperlipidemia, unspecified: Secondary | ICD-10-CM

## 2019-01-10 DIAGNOSIS — N529 Male erectile dysfunction, unspecified: Secondary | ICD-10-CM | POA: Diagnosis not present

## 2019-01-10 DIAGNOSIS — E1159 Type 2 diabetes mellitus with other circulatory complications: Secondary | ICD-10-CM

## 2019-01-10 DIAGNOSIS — I152 Hypertension secondary to endocrine disorders: Secondary | ICD-10-CM

## 2019-01-10 NOTE — Progress Notes (Signed)
  Subjective:    Patient ID: Jacob Swanson, male    DOB: 05/25/1951, 68 y.o.   MRN: [id number]  Jacob Swanson is a 68 y.o. male who presents for follow-up of Type 2 diabetes mellitus.  Patient is checking home blood sugars.   Home blood sugar records: meter record How often is blood sugars being checked: BID fasting, and 2 hours post meal 107-150 Current symptoms/problems include weight concern . Daily foot checks: yes   Any foot concerns: none at this time. Last eye exam: 08/2018 Dr. Elmer Picker Exercise: walking , weights He continues on metformin and is having no difficulty with that.  He has lost some weight recently due to lifestyle changes.  He has seen urology also about erectile dysfunction.  The Cialis does give him some intermittent benefit.  He also discussed other options concerning this and at this time is holding off on them.  He continues to do well on his CPAP.  Is also continuing on Crestor and Zetia as well as his blood pressure medications.  In general he is doing fairly well. The following portions of the patient's history were reviewed and updated as appropriate: allergies, current medications, past medical history, past social history and problem list.  ROS as in subjective above.     Objective:    Physical Exam Alert and in no distress otherwise not examined.  Blood pressure 132/80, pulse 84, temperature (!) 96.2 F (35.7 C), weight 184 lb (83.5 kg), SpO2 98 %.  Lab Review Diabetic Labs Latest Ref Rng & Units 09/07/2018 05/08/2018 01/03/2018 09/08/2017 05/09/2017  HbA1c 4.0 - 5.6 % 6.7(A) 7.0(A) 6.4 7.8 7.0%  Microalbumin mg/L - - - 7.8 -  Micro/Creat Ratio - - - - 38.8 -  Chol 100 - 199 mg/dL - - 062 - -  HDL >69 mg/dL - - 55 - -  Calc LDL 0 - 99 mg/dL - - 485(I) - -  Triglycerides 0 - 149 mg/dL - - 67 - -  Creatinine 0.76 - 1.27 mg/dL - - 6.27 - -   BP/Weight 01/10/2019 09/07/2018 08/17/2018 05/08/2018 01/03/2018  Systolic BP 132 158 120 142 138  Diastolic BP 80 96 70 88  84  Wt. (Lbs) 184 193 187.8 186.8 192  BMI 27.98 29.35 28.55 27.59 29.19   Foot/eye exam completion dates Latest Ref Rng & Units 01/03/2018 09/22/2017  Eye Exam No Retinopathy - No Retinopathy  Foot Form Completion - Done -  Hemoglobin A1c is 7.0  Jacob Swanson  reports that he has never smoked. He has never used smokeless tobacco. He reports current alcohol use. He reports that he does not use drugs.     Assessment & Plan:    Type 2 diabetes mellitus with complications (HCC)  Sleep apnea, unspecified type  Erectile dysfunction, unspecified erectile dysfunction type  Hyperlipidemia associated with type 2 diabetes mellitus (HCC)  Hypertension associated with diabetes (HCC)   1. Rx changes: none 2. Education: Reviewed 'ABCs' of diabetes management (respective goals in parentheses):  A1C (<7), blood pressure (<130/80), and cholesterol (LDL <100). 3. Compliance at present is estimated to be excellent. Efforts to improve compliance (if necessary) will be directed at No change. 4. Follow up: 4 months I congratulated him on his weight loss.  He will continue on his present medication regimen.

## 2019-01-11 ENCOUNTER — Encounter: Payer: Medicare Other | Admitting: Medical

## 2019-03-03 ENCOUNTER — Other Ambulatory Visit: Payer: Self-pay | Admitting: Family Medicine

## 2019-03-03 DIAGNOSIS — E118 Type 2 diabetes mellitus with unspecified complications: Secondary | ICD-10-CM

## 2019-04-13 ENCOUNTER — Encounter: Payer: Self-pay | Admitting: Family Medicine

## 2019-04-13 ENCOUNTER — Other Ambulatory Visit: Payer: Self-pay

## 2019-04-13 ENCOUNTER — Ambulatory Visit (INDEPENDENT_AMBULATORY_CARE_PROVIDER_SITE_OTHER): Payer: Medicare Other | Admitting: Family Medicine

## 2019-04-13 VITALS — BP 140/80 | HR 83 | Temp 98.2°F | Ht 68.5 in | Wt 184.4 lb

## 2019-04-13 DIAGNOSIS — H9191 Unspecified hearing loss, right ear: Secondary | ICD-10-CM

## 2019-04-13 DIAGNOSIS — Z8619 Personal history of other infectious and parasitic diseases: Secondary | ICD-10-CM

## 2019-04-13 DIAGNOSIS — Z23 Encounter for immunization: Secondary | ICD-10-CM | POA: Diagnosis not present

## 2019-04-13 MED ORDER — VALACYCLOVIR HCL 1 G PO TABS: 1000.0000 mg | ORAL_TABLET | Freq: Two times a day (BID) | ORAL | 2 refills | Status: DC

## 2019-04-13 NOTE — Progress Notes (Signed)
   Subjective:    Patient ID: Jacob Swanson, male    DOB: October 21, 1950, 68 y.o.   MRN: [id number]  HPI He states that 4 days ago he woke up with difficulty hearing from the right ear but no ear pain, sore throat, cough, congestion.  He states now that the hearing is getting slightly better. He would also like his Valtrex renewed. Review of Systems     Objective:   Physical Exam Alert and in no distress.  Tympanic membranes and canals are normal bilaterally.  Throat is clear.  Neck is supple without adenopathy or thyromegaly      Assessment & Plan:  Decreased hearing of right ear No further therapy at this time but if this does not can continue to go away, referral to ENT will be made. Need for influenza vaccination - Plan: Flu Vaccine QUAD High Dose(Fluad), CANCELED: Flu Vaccine QUAD 36+ mos IM (Fluarix & Fluzone Quad PF  H/O herpes labialis - Plan: valACYclovir (VALTREX) 1000 MG tablet

## 2019-04-23 ENCOUNTER — Other Ambulatory Visit: Payer: Self-pay | Admitting: Family Medicine

## 2019-04-23 DIAGNOSIS — Z8619 Personal history of other infectious and parasitic diseases: Secondary | ICD-10-CM

## 2019-04-23 NOTE — Telephone Encounter (Signed)
[facility] is requesting to fill pt valtrex. Please advise KH 

## 2019-05-06 ENCOUNTER — Other Ambulatory Visit: Payer: Self-pay | Admitting: Family Medicine

## 2019-05-06 DIAGNOSIS — Z8619 Personal history of other infectious and parasitic diseases: Secondary | ICD-10-CM

## 2019-05-10 ENCOUNTER — Ambulatory Visit (INDEPENDENT_AMBULATORY_CARE_PROVIDER_SITE_OTHER): Payer: Medicare Other | Admitting: Family Medicine

## 2019-05-10 ENCOUNTER — Encounter: Payer: Self-pay | Admitting: Family Medicine

## 2019-05-10 ENCOUNTER — Other Ambulatory Visit: Payer: Self-pay

## 2019-05-10 VITALS — BP 126/80 | HR 73 | Temp 97.8°F | Wt 182.2 lb

## 2019-05-10 DIAGNOSIS — E1159 Type 2 diabetes mellitus with other circulatory complications: Secondary | ICD-10-CM

## 2019-05-10 DIAGNOSIS — E785 Hyperlipidemia, unspecified: Secondary | ICD-10-CM

## 2019-05-10 DIAGNOSIS — E1169 Type 2 diabetes mellitus with other specified complication: Secondary | ICD-10-CM | POA: Diagnosis not present

## 2019-05-10 DIAGNOSIS — I10 Essential (primary) hypertension: Secondary | ICD-10-CM

## 2019-05-10 DIAGNOSIS — N529 Male erectile dysfunction, unspecified: Secondary | ICD-10-CM | POA: Diagnosis not present

## 2019-05-10 DIAGNOSIS — E118 Type 2 diabetes mellitus with unspecified complications: Secondary | ICD-10-CM

## 2019-05-10 DIAGNOSIS — E1121 Type 2 diabetes mellitus with diabetic nephropathy: Secondary | ICD-10-CM

## 2019-05-10 DIAGNOSIS — H6981 Other specified disorders of Eustachian tube, right ear: Secondary | ICD-10-CM

## 2019-05-10 DIAGNOSIS — I152 Hypertension secondary to endocrine disorders: Secondary | ICD-10-CM

## 2019-05-10 LAB — POCT UA - MICROALBUMIN
Albumin/Creatinine Ratio, Urine, POC: <3.1
Creatinine, POC: 162.1 mg/dL
Microalbumin Ur, POC: <5 mg/L

## 2019-05-10 LAB — POCT GLYCOSYLATED HEMOGLOBIN (HGB A1C): Hemoglobin A1C: 6.5 % — AB (ref 4.0–5.6)

## 2019-05-10 NOTE — Progress Notes (Signed)
  Subjective:    Patient ID: Jacob Swanson, male    DOB: [birth year], 68 y.o.   MRN: [id number]  Gergory Biello is a 68 y.o. male who presents for follow-up of Type 2 diabetes mellitus.  Patient is checking home blood sugars.   Home blood sugar records: meter records How often is blood sugars being checked: BID fasting and post meal usually in the low 100s  Current symptoms/problems none no symptoms daily foot checks Daily foot checks: yes    any foot concerns:no  Last eye exam: 1- 2020 Exercise: walking QD 45 minutes light weights  Taking hisCrestor and not having any aches and pains.  Continuing on the blood pressure medication without any trouble.  Still taking the Cialis and it is working well.  Uroxatrol for the bladder.  He has had some difficulty with decreased hearing on the right but this is slowly improving.  the following portions of the patient's history were reviewed and updated as appropriate: allergies, current medications, past medical history, past social history and problem list. ROS as in subjective above.     Objective:    Physical Exam Alert and in no distress cardiac exam shows regular rhythm without murmurs or gallops.  Lungs are clear to auscultation.  Right tympanic membrane does seem to be slightly retracted.  Foot exam is recorded and is normal Hemoglobin A1c is 6.5   Lab Review Diabetic Labs Latest Ref Rng & Units 09/07/2018 05/08/2018 01/03/2018 09/08/2017 05/09/2017  HbA1c 4.0 - 5.6 % 6.7(A) 7.0(A) 6.4 7.8 7.0%  Microalbumin mg/L - - - 7.8 -  Micro/Creat Ratio - - - - 38.8 -  Chol 100 - 199 mg/dL - - 195 - -  HDL >39 mg/dL - - 55 - -  Calc LDL 0 - 99 mg/dL - - 127(H) - -  Triglycerides 0 - 149 mg/dL - - 67 - -  Creatinine 0.76 - 1.27 mg/dL - - 1.16 - -   BP/Weight 04/13/2019 01/10/2019 09/07/2018 08/17/2018 12/23/7739  Systolic BP 287 867 672 094 709  Diastolic BP 80 80 96 70 88  Wt. (Lbs) 184.4 184 193 187.8 186.8  BMI 27.63 27.98 29.35 28.55 27.59   Foot/eye  exam completion dates Latest Ref Rng & Units 01/03/2018 09/22/2017  Eye Exam No Retinopathy - No Retinopathy  Foot Form Completion - Done -    Bradrick  reports that he has never smoked. He has never used smokeless tobacco. He reports current alcohol use. He reports that he does not use drugs.     Assessment & Plan:    Type 2 diabetes mellitus with complications ([place]) - Plan: CBC with Differential/Platelet, Comprehensive metabolic panel, Lipid panel, POCT UA - Microalbumin  Hyperlipidemia associated with type 2 diabetes mellitus ([place]) - Plan: Lipid panel  Hypertension associated with diabetes ([place]) - Plan: CBC with Differential/Platelet, Comprehensive metabolic panel  Erectile dysfunction, unspecified erectile dysfunction type  Diabetic nephropathy associated with type 2 diabetes mellitus ([place]) - Plan: POCT UA - Microalbumin  Dysfunction of right eustachian tube    1. Rx changes: none 2. Education: Reviewed 'ABCs' of diabetes management (respective goals in parentheses):  A1C (<7), blood pressure (<130/80), and cholesterol (LDL <100). 3. Compliance at present is estimated to be excellent. Efforts to improve compliance (if necessary) will be directed at No change. 4. Follow up: 4 months Recommended popping his ears and chewing gum to help with the ears.  Continue on present medications.

## 2019-05-11 LAB — LIPID PANEL
Chol/HDL Ratio: 3.2 ratio (ref 0.0–5.0)
Cholesterol, Total: 175 mg/dL (ref 100–199)
HDL: 54 mg/dL (ref >39)
LDL Chol Calc (NIH): 101 mg/dL — ABNORMAL HIGH (ref 0–99)
Triglycerides: 113 mg/dL (ref 0–149)
VLDL Cholesterol Cal: 20 mg/dL (ref 5–40)

## 2019-05-11 LAB — COMPREHENSIVE METABOLIC PANEL
ALT: 11 IU/L (ref 0–44)
AST: 15 IU/L (ref 0–40)
Albumin/Globulin Ratio: 1.8 (ref 1.2–2.2)
Albumin: 4.4 g/dL (ref 3.8–4.8)
Alkaline Phosphatase: 80 IU/L (ref 39–117)
BUN/Creatinine Ratio: 14 (ref 10–24)
BUN: 19 mg/dL (ref 8–27)
Bilirubin Total: 0.3 mg/dL (ref 0.0–1.2)
CO2: 23 mmol/L (ref 20–29)
Calcium: 10 mg/dL (ref 8.6–10.2)
Chloride: 103 mmol/L (ref 96–106)
Creatinine, Ser: 1.38 mg/dL — ABNORMAL HIGH (ref 0.76–1.27)
GFR calc Af Amer: 60 mL/min/{1.73_m2} (ref >59)
GFR calc non Af Amer: 52 mL/min/{1.73_m2} — ABNORMAL LOW (ref >59)
Globulin, Total: 2.4 g/dL (ref 1.5–4.5)
Glucose: 124 mg/dL — ABNORMAL HIGH (ref 65–99)
Potassium: 4.4 mmol/L (ref 3.5–5.2)
Sodium: 141 mmol/L (ref 134–144)
Total Protein: 6.8 g/dL (ref 6.0–8.5)

## 2019-05-11 LAB — CBC WITH DIFFERENTIAL/PLATELET
Basophils Absolute: 0 10*3/uL (ref 0.0–0.2)
Basos: 0 %
EOS (ABSOLUTE): 0.2 10*3/uL (ref 0.0–0.4)
Eos: 3 %
Hematocrit: 38.2 % (ref 37.5–51.0)
Hemoglobin: 12.6 g/dL — ABNORMAL LOW (ref 13.0–17.7)
Immature Grans (Abs): 0 10*3/uL (ref 0.0–0.1)
Immature Granulocytes: 0 %
Lymphocytes Absolute: 2.4 10*3/uL (ref 0.7–3.1)
Lymphs: 36 %
MCH: 30.6 pg (ref 26.6–33.0)
MCHC: 33 g/dL (ref 31.5–35.7)
MCV: 93 fL (ref 79–97)
Monocytes Absolute: 0.5 10*3/uL (ref 0.1–0.9)
Monocytes: 7 %
Neutrophils Absolute: 3.5 10*3/uL (ref 1.4–7.0)
Neutrophils: 54 %
Platelets: 232 10*3/uL (ref 150–450)
RBC: 4.12 x10E6/uL — ABNORMAL LOW (ref 4.14–5.80)
RDW: 12.7 % (ref 11.6–15.4)
WBC: 6.6 10*3/uL (ref 3.4–10.8)

## 2019-05-16 ENCOUNTER — Ambulatory Visit: Payer: Medicare Other | Admitting: Family Medicine

## 2019-05-22 ENCOUNTER — Other Ambulatory Visit: Payer: Self-pay | Admitting: Family Medicine

## 2019-05-22 DIAGNOSIS — Z8619 Personal history of other infectious and parasitic diseases: Secondary | ICD-10-CM

## 2019-05-22 NOTE — Telephone Encounter (Signed)
Is this okay to refill? 

## 2019-06-20 LAB — HM DIABETES EYE EXAM

## 2019-06-26 DIAGNOSIS — H409 Unspecified glaucoma: Secondary | ICD-10-CM | POA: Insufficient documentation

## 2019-06-28 ENCOUNTER — Other Ambulatory Visit: Payer: Self-pay | Admitting: Family Medicine

## 2019-06-28 DIAGNOSIS — E118 Type 2 diabetes mellitus with unspecified complications: Secondary | ICD-10-CM

## 2019-07-01 ENCOUNTER — Other Ambulatory Visit: Payer: Self-pay | Admitting: Family Medicine

## 2019-07-01 DIAGNOSIS — E118 Type 2 diabetes mellitus with unspecified complications: Secondary | ICD-10-CM

## 2019-09-04 DIAGNOSIS — H25013 Cortical age-related cataract, bilateral: Secondary | ICD-10-CM | POA: Diagnosis not present

## 2019-09-04 DIAGNOSIS — H40013 Open angle with borderline findings, low risk, bilateral: Secondary | ICD-10-CM | POA: Diagnosis not present

## 2019-09-04 DIAGNOSIS — H2513 Age-related nuclear cataract, bilateral: Secondary | ICD-10-CM | POA: Diagnosis not present

## 2019-09-04 DIAGNOSIS — H35033 Hypertensive retinopathy, bilateral: Secondary | ICD-10-CM | POA: Diagnosis not present

## 2019-09-04 DIAGNOSIS — H524 Presbyopia: Secondary | ICD-10-CM | POA: Diagnosis not present

## 2019-09-04 LAB — HM DIABETES EYE EXAM

## 2019-09-12 ENCOUNTER — Ambulatory Visit: Payer: Medicare Other | Admitting: Family Medicine

## 2019-09-14 ENCOUNTER — Encounter: Payer: Self-pay | Admitting: Family Medicine

## 2019-09-14 ENCOUNTER — Other Ambulatory Visit: Payer: Self-pay

## 2019-09-14 ENCOUNTER — Ambulatory Visit: Payer: Medicare PPO | Admitting: Family Medicine

## 2019-09-14 VITALS — BP 120/80 | HR 83 | Temp 97.8°F | Wt 187.4 lb

## 2019-09-14 DIAGNOSIS — N529 Male erectile dysfunction, unspecified: Secondary | ICD-10-CM | POA: Diagnosis not present

## 2019-09-14 DIAGNOSIS — I152 Hypertension secondary to endocrine disorders: Secondary | ICD-10-CM

## 2019-09-14 DIAGNOSIS — E1169 Type 2 diabetes mellitus with other specified complication: Secondary | ICD-10-CM

## 2019-09-14 DIAGNOSIS — H9191 Unspecified hearing loss, right ear: Secondary | ICD-10-CM | POA: Diagnosis not present

## 2019-09-14 DIAGNOSIS — E118 Type 2 diabetes mellitus with unspecified complications: Secondary | ICD-10-CM

## 2019-09-14 DIAGNOSIS — E1159 Type 2 diabetes mellitus with other circulatory complications: Secondary | ICD-10-CM | POA: Diagnosis not present

## 2019-09-14 DIAGNOSIS — E785 Hyperlipidemia, unspecified: Secondary | ICD-10-CM

## 2019-09-14 DIAGNOSIS — N3281 Overactive bladder: Secondary | ICD-10-CM

## 2019-09-14 DIAGNOSIS — I10 Essential (primary) hypertension: Secondary | ICD-10-CM | POA: Diagnosis not present

## 2019-09-14 NOTE — Progress Notes (Signed)
  Subjective:    Patient ID: Jacob Swanson, male    DOB: [birth year], 69 y.o.   MRN: [id number]  Jacob Swanson is a 69 y.o. male who presents for follow-up of Type 2 diabetes mellitus.  Home blood sugar records: meter record 110-150 avg post meal and fasting Current symptoms/problems include none at this time. Daily foot checks:yes   Any foot concerns: not at this time Exercise: walking , lights weights Diet: healthy  He is taking Crestor once a week stating he has had difficulty with muscle aches and pains.  He is taking his olmesartan/amlodipine and having no difficulty with that.  Metformin is causing no GI symptoms.  He does use Cialis on an as-needed basis.  He does state that the Uroxatrol has helped with his OAB symptoms.  He does note decreased hearing more so on the right. The following portions of the patient's history were reviewed and updated as appropriate: allergies, current medications, past medical history, past social history and problem list.  ROS as in subjective above.     Objective:    Physical Exam Alert and in no distress otherwise not examined.  A1c is 6.5 Lab Review Diabetic Labs Latest Ref Rng & Units 05/10/2019 09/07/2018 05/08/2018 01/03/2018 09/08/2017  HbA1c 4.0 - 5.6 % 6.5(A) 6.7(A) 7.0(A) 6.4 7.8  Microalbumin mg/L <5.0 - - - 7.8  Micro/Creat Ratio - <3.1 - - - 38.8  Chol 100 - 199 mg/dL 480 - - 165 -  HDL >53 mg/dL 54 - - 55 -  Calc LDL 0 - 99 mg/dL 748(O) - - 707(E) -  Triglycerides 0 - 149 mg/dL 675 - - 67 -  Creatinine 0.76 - 1.27 mg/dL 4.49(E) - - 0.10 -   BP/Weight 05/10/2019 04/13/2019 01/10/2019 09/07/2018 08/17/2018  Systolic BP 126 140 132 158 120  Diastolic BP 80 80 80 96 70  Wt. (Lbs) 182.2 184.4 184 193 187.8  BMI 27.3 27.63 27.98 29.35 28.55   Foot/eye exam completion dates Latest Ref Rng & Units 09/04/2019 05/10/2019  Eye Exam No Retinopathy No Retinopathy -  Foot Form Completion - - Done    Jacob Swanson  reports that he has never smoked. He has  never used smokeless tobacco. He reports current alcohol use. He reports that he does not use drugs.     Assessment & Plan:    Type 2 diabetes mellitus with complications (HCC)  Hyperlipidemia associated with type 2 diabetes mellitus (HCC)  Hypertension associated with diabetes (HCC)  Erectile dysfunction, unspecified erectile dysfunction type  Decreased hearing of right ear  OAB (overactive bladder)   1. Rx changes: none 2. Education: Reviewed 'ABCs' of diabetes management (respective goals in parentheses):  A1C (<7), blood pressure (<130/80), and cholesterol (LDL <100). 3. Compliance at present is estimated to be excellent. Efforts to improve compliance (if necessary) will be directed at Continue with diet and exercise. 4. Follow up: 6 months He will continue on his present medication regimen.  Discussed the issue with his hearing explaining that as he ages he might note increased difficulty and we can readdress this at a later date.  Recommend that he take his Crestor 3 times per week.  He is to let me know if he has difficulty with muscle aches and pains.  He will continue on his Uroxatrol.

## 2019-09-14 NOTE — Patient Instructions (Signed)
[email or web address] 

## 2019-10-04 DIAGNOSIS — R351 Nocturia: Secondary | ICD-10-CM | POA: Diagnosis not present

## 2019-10-04 DIAGNOSIS — N5201 Erectile dysfunction due to arterial insufficiency: Secondary | ICD-10-CM | POA: Diagnosis not present

## 2019-10-27 ENCOUNTER — Other Ambulatory Visit: Payer: Self-pay | Admitting: Family Medicine

## 2019-10-27 DIAGNOSIS — E118 Type 2 diabetes mellitus with unspecified complications: Secondary | ICD-10-CM

## 2019-10-31 ENCOUNTER — Ambulatory Visit: Payer: Medicare PPO | Admitting: Physician Assistant

## 2019-10-31 VITALS — BP 128/77 | HR 89 | Temp 97.1°F | Resp 16 | Ht 69.0 in | Wt 181.5 lb

## 2019-10-31 DIAGNOSIS — E118 Type 2 diabetes mellitus with unspecified complications: Secondary | ICD-10-CM

## 2019-10-31 DIAGNOSIS — J302 Other seasonal allergic rhinitis: Secondary | ICD-10-CM

## 2019-10-31 DIAGNOSIS — M25511 Pain in right shoulder: Secondary | ICD-10-CM

## 2019-10-31 DIAGNOSIS — M25561 Pain in right knee: Secondary | ICD-10-CM

## 2019-10-31 DIAGNOSIS — H9201 Otalgia, right ear: Secondary | ICD-10-CM

## 2019-10-31 LAB — GLUCOSE, POCT (MANUAL RESULT ENTRY): POC Glucose: 127 mg/dl — AB (ref 70–99)

## 2019-10-31 LAB — POCT GLYCOSYLATED HEMOGLOBIN (HGB A1C): Hemoglobin A1C: 6.5 % — AB (ref 4.0–5.6)

## 2019-10-31 NOTE — Progress Notes (Signed)
Concerns with right  Shoulder and knee pain Intermittent x 6 mos. Pain is 3-5/ 10

## 2019-10-31 NOTE — Progress Notes (Signed)
New Patient Office Visit  Subjective:  Patient ID: Jacob Swanson, male    DOB: 06/01/1951  Age: 69 y.o. MRN: [id number]  CC:  Chief Complaint  Patient presents with  . Shoulder Pain  . Knee Pain    HPI Loyal Holzheimer  Reports that he started having right shoulder pain approximately 3 to 6 months ago.  States it is worse in the morning, has been using icy hot with relief.  Is active with exercise.  Reports he is also been having right knee pain for approximately the same amount of time.  Worse in the morning, uses icy hot with relief. Has intermittently tried glucosamine.  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  Has been a lifetime exerciser.  Reports that he has been having some intermittent right ear pain, has not tried anything for relief.  Reports pain has been on and off for the last few months.  Denies injury, trauma.   Past Medical History:  Diagnosis Date  . Diabetes mellitus   . Dyslipidemia   . ED (erectile dysfunction)   . Hemorrhoids   . Herpes genitalis in men   . Hyperlipidemia   . Hypertension   . Sleep apnea     Past Surgical History:  Procedure Laterality Date  . CARDIAC CATHETERIZATION  05/2006  . COLONOSCOPY      Family History  Problem Relation Age of Onset  . Diabetes Mother   . Kidney disease Mother     Social History   Socioeconomic History  . Marital status: Single    Spouse name: Not on file  . Number of children: Not on file  . Years of education: Not on file  . Highest education level: Not on file  Occupational History  . Not on file  Tobacco Use  . Smoking status: Never Smoker  . Smokeless tobacco: Never Used  Substance and Sexual Activity  . Alcohol use: Yes    Comment: rare  . Drug use: No  . Sexual activity: Yes    Birth control/protection: Condom  Other Topics Concern  . Not on file  Social History Narrative  . Not on file   Social Determinants of Health    Financial Resource Strain:   . Difficulty of Paying Living Expenses: Not on file  Food Insecurity:   . Worried About Charity fundraiser in the Last Year: Not on file  . Ran Out of Food in the Last Year: Not on file  Transportation Needs:   . Lack of Transportation (Medical): Not on file  . Lack of Transportation (Non-Medical): Not on file  Physical Activity:   . Days of Exercise per Week: Not on file  . Minutes of Exercise per Session: Not on file  Stress:   . Feeling of Stress : Not on file  Social Connections:   . Frequency of Communication with Friends and Family: Not on file  . Frequency of Social Gatherings with Friends and Family: Not on file  . Attends Religious Services: Not on file  . Active Member of Clubs or Organizations: Not on file  . Attends Archivist Meetings: Not on file  . Marital Status: Not on file  Intimate Partner Violence:   . Fear of Current or Ex-Partner: Not on file  . Emotionally Abused: Not on file  . Physically Abused: Not on file  . Sexually Abused: Not on file    ROS  Review of Systems  Eyes: Positive for discharge. Negative for pain, redness and itching.  Respiratory: Positive for cough. Negative for shortness of breath.   Cardiovascular: Negative for leg swelling.  Gastrointestinal: Negative.   Endocrine: Negative.   Genitourinary: Negative.   Musculoskeletal: Positive for arthralgias and myalgias. Negative for back pain, joint swelling, neck pain and neck stiffness.  Skin: Negative.   Allergic/Immunologic: Negative.   Neurological: Negative.   Hematological: Negative.   Psychiatric/Behavioral: Negative.     Objective:   Today's Vitals: BP 128/77 (BP Location: Left Arm, Patient Position: Sitting, Cuff Size: Normal)   Pulse 89   Temp (!) 97.1 F (36.2 C)   Resp 16   Wt 181 lb 8 oz (82.3 kg)   SpO2 98%   BMI 27.20 kg/m   Physical Exam Vitals reviewed.  Constitutional:      Appearance: He is normal weight.  HENT:      Head: Normocephalic.     Right Ear: Tympanic membrane, ear canal and external ear normal. There is no impacted cerumen.     Left Ear: Tympanic membrane, ear canal and external ear normal. There is no impacted cerumen.     Nose: No congestion or rhinorrhea.     Right Turbinates: Swollen.     Left Turbinates: Swollen.     Right Sinus: No maxillary sinus tenderness or frontal sinus tenderness.     Left Sinus: No maxillary sinus tenderness or frontal sinus tenderness.     Mouth/Throat:     Mouth: Mucous membranes are moist.     Comments: Drainage noted Cardiovascular:     Rate and Rhythm: Normal rate and regular rhythm.     Pulses: Normal pulses.     Heart sounds: Normal heart sounds.  Pulmonary:     Effort: Pulmonary effort is normal.     Breath sounds: Normal breath sounds.  Abdominal:     General: Abdomen is flat. Bowel sounds are normal.     Palpations: Abdomen is soft.  Musculoskeletal:        General: Tenderness present. No swelling.     Right shoulder: Tenderness present. No swelling or crepitus. Normal range of motion.     Left shoulder: Normal.     Cervical back: Normal range of motion.     Right knee: Crepitus present. No swelling. Normal range of motion. No tenderness.     Left knee: Normal.     Right lower leg: No edema.     Left lower leg: No edema.  Skin:    General: Skin is warm and dry.  Neurological:     General: No focal deficit present.     Mental Status: He is alert and oriented to person, place, and time.  Psychiatric:        Mood and Affect: Mood normal.        Behavior: Behavior normal.        Thought Content: Thought content normal.        Judgment: Judgment normal.     Assessment & Plan:   Problem List Items Addressed This Visit      Endocrine   Type 2 diabetes mellitus with complications (HCC)   Relevant Orders   Glucose (CBG) (Completed)   HgB A1c (Completed)    Other Visit Diagnoses    Acute pain of right knee    -  Primary   Acute  pain of right shoulder       Acute ear pain, right  Seasonal allergies          Outpatient Encounter Medications as of 10/31/2019  Medication Sig  . alfuzosin (UROXATRAL) 10 MG 24 hr tablet Take 10 mg by mouth daily with breakfast.  . aspirin 81 MG tablet Take 81 mg by mouth daily.    Marland Kitchen co-enzyme Q-10 50 MG capsule Take 50 mg by mouth daily.    Marland Kitchen ezetimibe (ZETIA) 10 MG tablet Take 1 tablet (10 mg total) by mouth daily.  . Ginkgo Biloba 120 MG CAPS Take 1 capsule by mouth daily.    . metFORMIN (GLUCOPHAGE) 850 MG tablet TAKE 1 TABLET BY MOUTH TWICE A DAY WITH MEALS  . Multiple Vitamins-Minerals (MULTIVITAMIN WITH MINERALS) tablet Take 1 tablet by mouth daily.  . Olmesartan-amLODIPine-HCTZ 20-5-12.5 MG TABS TAKE 1 TABLET BY MOUTH EVERY DAY  . rosuvastatin (CRESTOR) 40 MG tablet Take 1 tablet (40 mg total) by mouth daily.  . tadalafil (CIALIS) 20 MG tablet Take 1 tablet (20 mg total) by mouth daily as needed for erectile dysfunction.  . valACYclovir (VALTREX) 1000 MG tablet TAKE 1 TABLET BY MOUTH TWICE A DAY  . ACCU-CHEK AVIVA PLUS test strip TEST AS DIRECTED   No facility-administered encounter medications on file as of 10/31/2019.  1. Type 2 diabetes mellitus with complications (HCC) Requested check of blood glucose and A1c.  Patient is managed by primary care provider for his type 2 diabetes.  Patient is well controlled.  Continue follow-up with primary care provider - Glucose (CBG) - HgB A1c  2. Acute pain of right knee  For your right knee I would suggest bracing, especially during exercise, continue BenGay as needed.  For both right shoulder and right knee I would suggest trial of glucosamine over-the-counter, continue vitamin D.  If pain persist or worsens, recommend following up with your primary care provider or returning to our local clinic for further evaluation.  3. Acute pain of right shoulder For your right shoulder pain I would suggest Voltaren over-the-counter, ice,  continue with stretching as tolerated, evaluate sleeping positions.    For both right shoulder and right knee I would suggest trial of glucosamine over-the-counter, continue vitamin D.  If pain persist or worsens, recommend following up with your primary care provider or returning to our local clinic for further evaluation.    4. Acute ear pain, right  Right ear pain, there does appear to be some fluid behind your ear drums, you also had sinus drainage and evidence of possible seasonal allergies, would recommend over-the-counter Claritin or Zyrtec, avoid any decongestants.    5. Seasonal allergies  Right ear pain, there does appear to be some fluid behind your ear drums, you also had sinus drainage and evidence of possible seasonal allergies, would recommend over-the-counter Claritin or Zyrtec, avoid any decongestants.    Follow-up: Return if symptoms worsen or fail to improve.   Kasandra Knudsen Mayers, PA-C

## 2019-10-31 NOTE — Patient Instructions (Signed)
For your right shoulder pain I would suggest Voltaren over-the-counter, ice, continue with stretching as tolerated, evaluate sleeping positions.  For your right knee I would suggest bracing, especially during exercise, continue BenGay as needed.  For both right shoulder and right knee I would suggest trial of glucosamine over-the-counter, continue vitamin D.  If pain persist or worsens, recommend following up with your primary care provider or returning to our local clinic for further evaluation.  Right ear pain, there does appear to be some fluid behind your ear drums, you also had sinus drainage and evidence of possible seasonal allergies, would recommend over-the-counter Claritin or Zyrtec, avoid any decongestants. Allergic Rhinitis, Adult Allergic rhinitis is a reaction to allergens in the air. Allergens are tiny specks (particles) in the air that cause your body to have an allergic reaction. This condition cannot be passed from person to person (is not contagious). Allergic rhinitis cannot be cured, but it can be controlled. There are two types of allergic rhinitis:  Seasonal. This type is also called hay fever. It happens only during certain times of the year.  Perennial. This type can happen at any time of the year. What are the causes? This condition may be caused by:  Pollen from grasses, trees, and weeds.  House dust mites.  Pet dander.  Mold. What are the signs or symptoms? Symptoms of this condition include:  Sneezing.  Runny or stuffy nose (nasal congestion).  A lot of mucus in the back of the throat (postnasal drip).  Itchy nose.  Tearing of the eyes.  Trouble sleeping.  Being sleepy during day. How is this treated? There is no cure for this condition. You should avoid things that trigger your symptoms (allergens). Treatment can help to relieve symptoms. This may include:  Medicines that block allergy symptoms, such as antihistamines. These may be given as a  shot, nasal spray, or pill.  Shots that are given until your body becomes less sensitive to the allergen (desensitization).  Stronger medicines, if all other treatments have not worked. Follow these instructions at home: Avoiding allergens   Find out what you are allergic to. Common allergens include smoke, dust, and pollen.  Avoid them if you can. These are some of the things that you can do to avoid allergens: ? Replace carpet with wood, tile, or vinyl flooring. Carpet can trap dander and dust. ? Clean any mold found in the home. ? Do not smoke. Do not allow smoking in your home. ? Change your heating and air conditioning filter at least once a month. ? During allergy season:  Keep windows closed as much as you can. If possible, use air conditioning when there is a lot of pollen in the air.  Use a special filter for allergies with your furnace and air conditioner.  Plan outdoor activities when pollen counts are lowest. This is usually during the early morning or evening hours.  If you do go outdoors when pollen count is high, wear a special mask for people with allergies.  When you come indoors, take a shower and change your clothes before sitting on furniture or bedding. General instructions  Do not use fans in your home.  Do not hang clothes outside to dry.  Wear sunglasses to keep pollen out of your eyes.  Wash your hands right away after you touch household pets.  Take over-the-counter and prescription medicines only as told by your doctor.  Keep all follow-up visits as told by your doctor. This is important.  Contact a doctor if:  You have a fever.  You have a cough that does not go away (is persistent).  You start to make whistling sounds when you breathe (wheeze).  Your symptoms do not get better with treatment.  You have thick fluid coming from your nose.  You start to have nosebleeds. Get help right away if:  Your tongue or your lips are swollen.   You have trouble breathing.  You feel dizzy or you feel like you are going to pass out (faint).  You have cold sweats. Summary  Allergic rhinitis is a reaction to allergens in the air.  This condition may be caused by allergens. These include pollen, dust mites, pet dander, and mold.  Symptoms include a runny, itchy nose, sneezing, or tearing eyes. You may also have trouble sleeping or feel sleepy during the day.  Treatment includes taking medicines and avoiding allergens. You may also get shots or take stronger medicines.  Get help if you have a fever or a cough that does not stop. Get help right away if you are short of breath. This information is not intended to replace advice given to you by your health care provider. Make sure you discuss any questions you have with your health care provider. Document Revised: 11/28/2018 Document Reviewed: 02/28/2018 Elsevier Patient Education  [place].  Acute Knee Pain, Adult Many things can cause knee pain. Sometimes, knee pain is sudden (acute) and may be caused by damage, swelling, or irritation of the muscles and tissues that support your knee. The pain often goes away on its own with time and rest. If the pain does not go away, tests may be done to find out what is causing the pain. Follow these instructions at home: Pay attention to any changes in your symptoms. Take these actions to relieve your pain. If you have a knee sleeve or brace:   Wear the sleeve or brace as told by your doctor. Remove it only as told by your doctor.  Loosen the sleeve or brace if your toes: ? Tingle. ? Become numb. ? Turn cold and blue.  Keep the sleeve or brace clean.  If the sleeve or brace is not waterproof: ? Do not let it get wet. ? Cover it with a watertight covering when you take a bath or shower. Activity  Rest your knee.  Do not do things that cause pain.  Avoid activities where both feet leave the ground at the same time  (high-impact activities). Examples are running, jumping rope, and doing jumping jacks.  Work with a physical therapist to make a safe exercise program, as told by your doctor. Managing pain, stiffness, and swelling   If told, put ice on the knee: ? Put ice in a plastic bag. ? Place a towel between your skin and the bag. ? Leave the ice on for 20 minutes, 2-3 times a day.  If told, put pressure (compression) on your injured knee to control swelling, give support, and help with discomfort. Compression may be done with an elastic bandage. General instructions  Take all medicines only as told by your doctor.  Raise (elevate) your knee while you are sitting or lying down. Make sure your knee is higher than your heart.  Sleep with a pillow under your knee.  Do not use any products that contain nicotine or tobacco. These include cigarettes, e-cigarettes, and chewing tobacco. These products may slow down healing. If you need help quitting, ask your doctor.  If you are overweight, work with your doctor and a food expert (dietitian) to set goals to lose weight. Being overweight can make your knee hurt more.  Keep all follow-up visits as told by your doctor. This is important. Contact a doctor if:  The knee pain does not stop.  The knee pain changes or gets worse.  You have a fever along with knee pain.  Your knee feels warm when you touch it.  Your knee gives out or locks up. Get help right away if:  Your knee swells, and the swelling gets worse.  You cannot move your knee.  You have very bad knee pain. Summary  Many things can cause knee pain. The pain often goes away on its own with time and rest.  Your doctor may do tests to find out the cause of the pain.  Pay attention to any changes in your symptoms. Relieve your pain with rest, medicines, light activity, and use of ice.  Get help right away if you cannot move your knee or your knee pain is very bad. This information  is not intended to replace advice given to you by your health care provider. Make sure you discuss any questions you have with your health care provider. Document Revised: 01/19/2018 Document Reviewed: 01/19/2018 Elsevier Patient Education  2020 [employer]  Shoulder Pain Many things can cause shoulder pain, including:  An injury.  Moving the shoulder in the same way again and again (overuse).  Joint pain (arthritis). Pain can come from:  Swelling and irritation (inflammation) of any part of the shoulder.  An injury to the shoulder joint.  An injury to: ? Tissues that connect muscle to bone (tendons). ? Tissues that connect bones to each other (ligaments). ? Bones. Follow these instructions at home: Watch for changes in your symptoms. Let your doctor know about them. Follow these instructions to help with your pain. If you have a sling:  Wear the sling as told by your doctor. Remove it only as told by your doctor.  Loosen the sling if your fingers: ? Tingle. ? Become numb. ? Turn cold and blue.  Keep the sling clean.  If the sling is not waterproof: ? Do not let it get wet. ? Take the sling off when you shower or bathe. Managing pain, stiffness, and swelling   If told, put ice on the painful area: ? Put ice in a plastic bag. ? Place a towel between your skin and the bag. ? Leave the ice on for 20 minutes, 2-3 times a day. Stop putting ice on if it does not help with the pain.  Squeeze a soft ball or a foam pad as much as possible. This prevents swelling in the shoulder. It also helps to strengthen the arm. General instructions  Take over-the-counter and prescription medicines only as told by your doctor.  Keep all follow-up visits as told by your doctor. This is important. Contact a doctor if:  Your pain gets worse.  Medicine does not help your pain.  You have new pain in your arm, hand, or fingers. Get help right away if:  Your arm, hand, or fingers:  ? Tingle. ? Are numb. ? Are swollen. ? Are painful. ? Turn white or blue. Summary  Shoulder pain can be caused by many things. These include injury, moving the shoulder in the same away again and again, and joint pain.  Watch for changes in your symptoms. Let your doctor know about them.  This  condition may be treated with a sling, ice, and pain medicine.  Contact your doctor if the pain gets worse or you have new pain. Get help right away if your arm, hand, or fingers tingle or get numb, swollen, or painful.  Keep all follow-up visits as told by your doctor. This is important. This information is not intended to replace advice given to you by your health care provider. Make sure you discuss any questions you have with your health care provider. Document Revised: 02/21/2018 Document Reviewed: 02/21/2018 Elsevier Patient Education  2020 [employer].

## 2019-11-06 ENCOUNTER — Encounter: Payer: Self-pay | Admitting: Family Medicine

## 2019-11-06 ENCOUNTER — Other Ambulatory Visit: Payer: Self-pay

## 2019-11-06 ENCOUNTER — Ambulatory Visit: Payer: Medicare PPO | Admitting: Family Medicine

## 2019-11-06 VITALS — BP 142/80 | HR 78 | Temp 96.0°F | Wt 188.6 lb

## 2019-11-06 DIAGNOSIS — M26621 Arthralgia of right temporomandibular joint: Secondary | ICD-10-CM

## 2019-11-06 NOTE — Patient Instructions (Signed)
Work on chewing softer foods, use Tylenol for pain relief and also a mouthguard

## 2019-11-06 NOTE — Progress Notes (Signed)
   Subjective:    Patient ID: Jacob Swanson, male    DOB: 1951/04/09, 69 y.o.   MRN: [id number]  HPI He complains of a 2-week history of right ear pain as well as slight decreased hearing and questionable sore throat but no fever chills or coughing.  The pain is made worse with chewing.   Review of Systems     Objective:   Physical Exam  Alert and in no distress.  The right TM and canal are totally normal.  Left TM does show some slight opacity but no other problems.  Slight tenderness over the right TMJ.      Assessment & Plan:  Arthralgia of right temporomandibular joint I explained that this ear pain is probably referred pain from the TMJ.  Discussed proper care of eating softer foods as well as using a mouthguard.  Apparently he does have one at home and I strongly encouraged him to go ahead and use it.

## 2019-11-22 ENCOUNTER — Ambulatory Visit: Payer: Medicare PPO | Admitting: Family Medicine

## 2019-11-28 ENCOUNTER — Other Ambulatory Visit: Payer: Self-pay | Admitting: Family Medicine

## 2019-11-28 DIAGNOSIS — E118 Type 2 diabetes mellitus with unspecified complications: Secondary | ICD-10-CM

## 2019-12-02 DIAGNOSIS — Z0000 Encounter for general adult medical examination without abnormal findings: Secondary | ICD-10-CM | POA: Diagnosis not present

## 2019-12-02 DIAGNOSIS — Z6826 Body mass index (BMI) 26.0-26.9, adult: Secondary | ICD-10-CM | POA: Diagnosis not present

## 2019-12-16 ENCOUNTER — Other Ambulatory Visit: Payer: Self-pay | Admitting: Family Medicine

## 2019-12-16 DIAGNOSIS — E118 Type 2 diabetes mellitus with unspecified complications: Secondary | ICD-10-CM

## 2020-03-10 ENCOUNTER — Ambulatory Visit: Payer: Medicare PPO | Admitting: Family Medicine

## 2020-03-10 ENCOUNTER — Other Ambulatory Visit: Payer: Self-pay

## 2020-03-10 ENCOUNTER — Encounter: Payer: Self-pay | Admitting: Family Medicine

## 2020-03-10 VITALS — BP 122/70 | HR 74 | Temp 97.7°F | Wt 188.2 lb

## 2020-03-10 DIAGNOSIS — T63451A Toxic effect of venom of hornets, accidental (unintentional), initial encounter: Secondary | ICD-10-CM

## 2020-03-10 MED ORDER — AMOXICILLIN-POT CLAVULANATE 875-125 MG PO TABS: 1.0000 | ORAL_TABLET | Freq: Two times a day (BID) | ORAL | 0 refills | Status: DC

## 2020-03-10 NOTE — Progress Notes (Signed)
   Subjective:    Patient ID: Jacob Swanson, male    DOB: 1951-07-12, 69 y.o.   MRN: [id number]  HPI He was incidentally stung by several hornets last Saturday.  He does not remember whether there was any swelling or erythema at the start of this but did notice that he had swelling and redness Sunday.   Review of Systems     Objective:   Physical Exam Alert and complaining of bilateral forearm pain with erythema warmth and tenderness.       Assessment & Plan:  Hornet sting, accidental or unintentional, initial encounter - Plan: amoxicillin-clavulanate (AUGMENTIN) 875-125 MG tablet I explained that it is difficult to say whether this is local reaction from insect bite or possibly an infection.  Antibiotic was called in however he will wait till tomorrow to decide whether to take it.  He was comfortable with that.

## 2020-03-10 NOTE — Patient Instructions (Signed)
Take Benadryl or Claritin for the itching and ibuprofen or Aleve for the pain.  No start taking the antibiotic to see if this goes away on its own mostly and if it does not then start it tomorrow

## 2020-03-19 ENCOUNTER — Encounter: Payer: Self-pay | Admitting: Family Medicine

## 2020-03-19 ENCOUNTER — Other Ambulatory Visit: Payer: Self-pay

## 2020-03-19 ENCOUNTER — Ambulatory Visit: Payer: Medicare PPO | Admitting: Family Medicine

## 2020-03-19 VITALS — BP 110/78 | HR 71 | Temp 98.3°F | Wt 182.0 lb

## 2020-03-19 DIAGNOSIS — I152 Hypertension secondary to endocrine disorders: Secondary | ICD-10-CM

## 2020-03-19 DIAGNOSIS — N529 Male erectile dysfunction, unspecified: Secondary | ICD-10-CM | POA: Diagnosis not present

## 2020-03-19 DIAGNOSIS — N3281 Overactive bladder: Secondary | ICD-10-CM | POA: Diagnosis not present

## 2020-03-19 DIAGNOSIS — E1159 Type 2 diabetes mellitus with other circulatory complications: Secondary | ICD-10-CM

## 2020-03-19 DIAGNOSIS — E1169 Type 2 diabetes mellitus with other specified complication: Secondary | ICD-10-CM

## 2020-03-19 DIAGNOSIS — E785 Hyperlipidemia, unspecified: Secondary | ICD-10-CM | POA: Diagnosis not present

## 2020-03-19 DIAGNOSIS — I10 Essential (primary) hypertension: Secondary | ICD-10-CM | POA: Diagnosis not present

## 2020-03-19 DIAGNOSIS — E118 Type 2 diabetes mellitus with unspecified complications: Secondary | ICD-10-CM

## 2020-03-19 LAB — POCT GLYCOSYLATED HEMOGLOBIN (HGB A1C): Hemoglobin A1C: 6.6 % — AB (ref 4.0–5.6)

## 2020-03-19 NOTE — Progress Notes (Signed)
  Subjective:    Patient ID: Jacob Swanson, male    DOB: Aug 07, 1951, 69 y.o.   MRN: [id number]  Arvil Utz is a 69 y.o. male who presents for follow-up of Type 2 diabetes mellitus.  Home blood sugar records: meter record, 110-130 , post meal and fasting  Current symptoms/problems include none at this time. Daily foot checks: yes   Any foot concerns: none  Exercise: Gym  Diet: good   Eye exam in May He continues on Metformin and having no difficulty with that.  The Uroxatrol is helping for his OAB.  He continues on olmesartan/amlodipine and having no difficulty with that.  Does occasionally use Cialis for ED.  He is also taking Zetia and Crestor. The following portions of the patient's history were reviewed and updated as appropriate: allergies, current medications, past medical history, past social history and problem list. He is retired and does keep himself fairly busy. ROS as in subjective above.     Objective:    Physical Exam Alert and in no distress otherwise not examined. Hemoglobin A1c is 6.6  Lab Review Diabetic Labs Latest Ref Rng & Units 10/31/2019 05/10/2019 09/07/2018 05/08/2018 01/03/2018  HbA1c 4.0 - 5.6 % 6.5(A) 6.5(A) 6.7(A) 7.0(A) 6.4  Microalbumin mg/L - <5.0 - - -  Micro/Creat Ratio - - <3.1 - - -  Chol 100 - 199 mg/dL - 945 - - 038  HDL >88 mg/dL - 54 - - 55  Calc LDL 0 - 99 mg/dL - 280(K) - - 349(Z)  Triglycerides 0 - 149 mg/dL - 791 - - 67  Creatinine 0.76 - 1.27 mg/dL - 5.05(W) - - 9.79   BP/Weight 03/10/2020 11/06/2019 10/31/2019 09/14/2019 05/10/2019  Systolic BP 122 142 128 120 126  Diastolic BP 70 80 77 80 80  Wt. (Lbs) 188.2 188.6 181.5 187.4 182.2  BMI 27.79 27.85 26.8 28.08 27.3   Foot/eye exam completion dates Latest Ref Rng & Units 09/04/2019 06/20/2019  Eye Exam No Retinopathy No Retinopathy Retinopathy(A)  Foot Form Completion - - -    Ambrose  reports that he has never smoked. He has never used smokeless tobacco. He reports current alcohol use. He  reports that he does not use drugs.     Assessment & Plan:    Type 2 diabetes mellitus with complications (HCC) - Plan: POCT glycosylated hemoglobin (Hb A1C)  Hyperlipidemia associated with type 2 diabetes mellitus (HCC)  Hypertension associated with diabetes (HCC)  Erectile dysfunction, unspecified erectile dysfunction type  OAB (overactive bladder)   1. Rx changes: none he is doing well on his present medication regimen and will continue on them. 2. Education: Reviewed 'ABCs' of diabetes management (respective goals in parentheses):  A1C (<7), blood pressure (<130/80), and cholesterol (LDL <100). 3. Compliance at present is estimated to be excellent. Efforts to improve compliance (if necessary) will be directed at No change. 4. Follow up: 4 months for complete exam and annual wellness visit.

## 2020-06-03 ENCOUNTER — Other Ambulatory Visit: Payer: Self-pay

## 2020-06-03 DIAGNOSIS — N529 Male erectile dysfunction, unspecified: Secondary | ICD-10-CM

## 2020-06-03 MED ORDER — TADALAFIL 20 MG PO TABS: 20.0000 mg | ORAL_TABLET | ORAL | 0 refills | Status: DC | PRN

## 2020-07-09 ENCOUNTER — Other Ambulatory Visit: Payer: Self-pay

## 2020-07-09 ENCOUNTER — Encounter: Payer: Self-pay | Admitting: Urology

## 2020-07-09 ENCOUNTER — Ambulatory Visit (INDEPENDENT_AMBULATORY_CARE_PROVIDER_SITE_OTHER): Payer: Medicare PPO | Admitting: Urology

## 2020-07-09 VITALS — BP 154/76 | HR 79 | Temp 98.4°F | Wt 187.0 lb

## 2020-07-09 DIAGNOSIS — R351 Nocturia: Secondary | ICD-10-CM

## 2020-07-09 DIAGNOSIS — N529 Male erectile dysfunction, unspecified: Secondary | ICD-10-CM

## 2020-07-09 LAB — URINALYSIS, ROUTINE W REFLEX MICROSCOPIC
Bilirubin, UA: NEGATIVE
Ketones, UA: NEGATIVE
Leukocytes,UA: NEGATIVE
Nitrite, UA: NEGATIVE
Protein,UA: NEGATIVE
Specific Gravity, UA: 1.025 (ref 1.005–1.030)
Urobilinogen, Ur: 0.2 mg/dL (ref 0.2–1.0)
pH, UA: 5.5 (ref 5.0–7.5)

## 2020-07-09 LAB — MICROSCOPIC EXAMINATION
Bacteria, UA: NONE SEEN
Epithelial Cells (non renal): NONE SEEN /hpf (ref 0–10)
Renal Epithel, UA: NONE SEEN /hpf
WBC, UA: NONE SEEN /hpf (ref 0–5)

## 2020-07-09 MED ORDER — TADALAFIL 5 MG PO TABS
5.0000 mg | ORAL_TABLET | Freq: Every day | ORAL | 11 refills | Status: DC
Start: 2020-07-09 — End: 2021-09-23

## 2020-07-09 MED ORDER — ALFUZOSIN HCL ER 10 MG PO TB24: 10.0000 mg | ORAL_TABLET | Freq: Every day | ORAL | 3 refills | Status: DC

## 2020-07-09 MED ORDER — TADALAFIL 20 MG PO TABS: 20.0000 mg | ORAL_TABLET | ORAL | 3 refills | Status: DC | PRN

## 2020-07-09 NOTE — Patient Instructions (Signed)
Erectile Dysfunction Erectile dysfunction (ED) is the inability to get or keep an erection in order to have sexual intercourse. Erectile dysfunction may include:  Inability to get an erection.  Lack of enough hardness of the erection to allow penetration.  Loss of the erection before sex is finished. What are the causes? This condition may be caused by:  Certain medicines, such as: ? Pain relievers. ? Antihistamines. ? Antidepressants. ? Blood pressure medicines. ? Water pills (diuretics). ? Ulcer medicines. ? Muscle relaxants. ? Drugs.  Excessive drinking.  Psychological causes, such as: ? Anxiety. ? Depression. ? Sadness. ? Exhaustion. ? Performance fear. ? Stress.  Physical causes, such as: ? Artery problems. This may include diabetes, smoking, liver disease, or atherosclerosis. ? High blood pressure. ? Hormonal problems, such as low testosterone. ? Obesity. ? Nerve problems. This may include back or pelvic injuries, diabetes mellitus, multiple sclerosis, or Parkinson disease. What are the signs or symptoms? Symptoms of this condition include:  Inability to get an erection.  Lack of enough hardness of the erection to allow penetration.  Loss of the erection before sex is finished.  Normal erections at some times, but with frequent unsatisfactory episodes.  Low sexual satisfaction in either partner due to erection problems.  A curved penis occurring with erection. The curve may cause pain or the penis may be too curved to allow for intercourse.  Never having nighttime erections. How is this diagnosed? This condition is often diagnosed by:  Performing a physical exam to find other diseases or specific problems with the penis.  Asking you detailed questions about the problem.  Performing blood tests to check for diabetes mellitus or to measure hormone levels.  Performing other tests to check for underlying health conditions.  Performing an ultrasound  exam to check for scarring.  Performing a test to check blood flow to the penis.  Doing a sleep study at home to measure nighttime erections. How is this treated? This condition may be treated by:  Medicine taken by mouth to help you achieve an erection (oral medicine).  Hormone replacement therapy to replace low testosterone levels.  Medicine that is injected into the penis. Your health care provider may instruct you how to give yourself these injections at home.  Vacuum pump. This is a pump with a ring on it. The pump and ring are placed on the penis and used to create pressure that helps the penis become erect.  Penile implant surgery. In this procedure, you may receive: ? An inflatable implant. This consists of cylinders, a pump, and a reservoir. The cylinders can be inflated with a fluid that helps to create an erection, and they can be deflated after intercourse. ? A semi-rigid implant. This consists of two silicone rubber rods. The rods provide some rigidity. They are also flexible, so the penis can both curve downward in its normal position and become straight for sexual intercourse.  Blood vessel surgery, to improve blood flow to the penis. During this procedure, a blood vessel from a different part of the body is placed into the penis to allow blood to flow around (bypass) damaged or blocked blood vessels.  Lifestyle changes, such as exercising more, losing weight, and quitting smoking. Follow these instructions at home: Medicines   Take over-the-counter and prescription medicines only as told by your health care provider. Do not increase the dosage without first discussing it with your health care provider.  If you are using self-injections, perform injections as directed by your   health care provider. Make sure to avoid any veins that are on the surface of the penis. After giving an injection, apply pressure to the injection site for 5 minutes. General  instructions  Exercise regularly, as directed by your health care provider. Work with your health care provider to lose weight, if needed.  Do not use any products that contain nicotine or tobacco, such as cigarettes and e-cigarettes. If you need help quitting, ask your health care provider.  Before using a vacuum pump, read the instructions that come with the pump and discuss any questions with your health care provider.  Keep all follow-up visits as told by your health care provider. This is important. Contact a health care provider if:  You feel nauseous.  You vomit. Get help right away if:  You are taking oral or injectable medicines and you have an erection that lasts longer than 4 hours. If your health care provider is unavailable, go to the nearest emergency room for evaluation. An erection that lasts much longer than 4 hours can result in permanent damage to your penis.  You have severe pain in your groin or abdomen.  You develop redness or severe swelling of your penis.  You have redness spreading up into your groin or lower abdomen.  You are unable to urinate.  You experience chest pain or a rapid heart beat (palpitations) after taking oral medicines. Summary  Erectile dysfunction (ED) is the inability to get or keep an erection during sexual intercourse. This problem can usually be treated successfully.  This condition is diagnosed based on a physical exam, your symptoms, and tests to determine the cause. Treatment varies depending on the cause, and may include medicines, hormone therapy, surgery, or vacuum pump.  You may need follow-up visits to make sure that you are using your medicines or devices correctly.  Get help right away if you are taking or injecting medicines and you have an erection that lasts longer than 4 hours. This information is not intended to replace advice given to you by your health care provider. Make sure you discuss any questions you have with  your health care provider. Document Revised: 07/22/2017 Document Reviewed: 08/25/2016 Elsevier Patient Education  2020 [employer]  

## 2020-07-09 NOTE — Progress Notes (Signed)
07/09/2020 10:08 AM   Jacob Swanson 22-Dec-1950 [id number]  Referring provider: Ronnald Nian, MD [address] [place],  [place] [address]  Followup nocturia and erectile dysfunction  HPI: Jacob Swanson is a 69yo here for followup for erectile dysfunction and nocturia. He has stable LUTS on uroxatral 10mg  qhs. He has nocturia 0-2x based on fluid consumption. Stream strong. He takes 20mg  cialis prn for ED which works well but it takes 3-4 hours for him to get a good erection. The erection lasts a sufficient time   PMH: Past Medical History:  Diagnosis Date  . Diabetes mellitus   . Dyslipidemia   . ED (erectile dysfunction)   . Hemorrhoids   . Herpes genitalis in men   . Hyperlipidemia   . Hypertension   . Sleep apnea     Surgical History: Past Surgical History:  Procedure Laterality Date  . CARDIAC CATHETERIZATION  05/2006  . COLONOSCOPY      Home Medications:  Allergies as of 07/09/2020   No Known Allergies     Medication List       Accurate as of July 09, 2020 10:08 AM. If you have any questions, ask your nurse or doctor.        STOP taking these medications   amoxicillin-clavulanate 875-125 MG tablet Commonly known as: AUGMENTIN Stopped by: 07/11/2020, MD     TAKE these medications   Accu-Chek Aviva Plus test strip Generic drug: glucose blood TEST AS DIRECTED   alfuzosin 10 MG 24 hr tablet Commonly known as: UROXATRAL Take 10 mg by mouth daily with breakfast.   aspirin 81 MG tablet Take 81 mg by mouth daily.   BENADRYL PO Take by mouth.   co-enzyme Q-10 50 MG capsule Take 50 mg by mouth daily.   ezetimibe 10 MG tablet Commonly known as: Zetia Take 1 tablet (10 mg total) by mouth daily.   Ginkgo Biloba 120 MG Caps Take 1 capsule by mouth daily.   metFORMIN 850 MG tablet Commonly known as: GLUCOPHAGE TAKE 1 TABLET BY MOUTH TWICE A DAY WITH MEALS   multivitamin with minerals tablet Take 1 tablet by mouth daily.     Olmesartan-amLODIPine-HCTZ 20-5-12.5 MG Tabs TAKE 1 TABLET BY MOUTH EVERY DAY   rosuvastatin 40 MG tablet Commonly known as: CRESTOR Take 1 tablet (40 mg total) by mouth daily.   tadalafil 20 MG tablet Commonly known as: Cialis Take 1 tablet (20 mg total) by mouth as needed for erectile dysfunction.   valACYclovir 1000 MG tablet Commonly known as: VALTREX TAKE 1 TABLET BY MOUTH TWICE A DAY       Allergies: No Known Allergies  Family History: Family History  Problem Relation Age of Onset  . Diabetes Mother   . Kidney disease Mother     Social History:  reports that he has never smoked. He has never used smokeless tobacco. He reports current alcohol use. He reports that he does not use drugs.  ROS: All other review of systems were reviewed and are negative except what is noted above in HPI  Physical Exam: BP (!) 154/76   Pulse 79   Temp 98.4 F (36.9 C)   Wt 187 lb (84.8 kg)   BMI 27.62 kg/m   Constitutional:  Alert and oriented, No acute distress. HEENT:  AT, moist mucus membranes.  Trachea midline, no masses. Cardiovascular: No clubbing, cyanosis, or edema. Respiratory: Normal respiratory effort, no increased work of breathing. GI: Abdomen is soft, nontender, nondistended, no abdominal  masses GU: No CVA tenderness.  Lymph: No cervical or inguinal lymphadenopathy. Skin: No rashes, bruises or suspicious lesions. Neurologic: Grossly intact, no focal deficits, moving all 4 extremities. Psychiatric: Normal mood and affect.  Laboratory Data: Lab Results  Component Value Date   WBC 6.6 05/10/2019   HGB 12.6 (L) 05/10/2019   HCT 38.2 05/10/2019   MCV 93 05/10/2019   PLT 232 05/10/2019    Lab Results  Component Value Date   CREATININE 1.38 (H) 05/10/2019    No results found for: PSA  Lab Results  Component Value Date   TESTOSTERONE 345 09/07/2018    Lab Results  Component Value Date   HGBA1C 6.6 (A) 03/19/2020    Urinalysis    Component Value  Date/Time   LABSPEC 1.030 09/08/2017 1200   BILIRUBINUR negative 09/08/2017 1200   BILIRUBINUR neg 01/14/2016 1004   KETONESUR trace (5) (A) 09/08/2017 1200   PROTEINUR trace (A) 09/08/2017 1200   PROTEINUR neg 01/14/2016 1004   UROBILINOGEN negative 01/14/2016 1004   NITRITE Negative 09/08/2017 1200   NITRITE neg 01/14/2016 1004   LEUKOCYTESUR Negative 09/08/2017 1200    No results found for: LABMICR, WBCUA, RBCUA, LABEPIT, MUCUS, BACTERIA  Pertinent Imaging:  No results found for this or any previous visit.  No results found for this or any previous visit.  No results found for this or any previous visit.  No results found for this or any previous visit.  No results found for this or any previous visit.  No results found for this or any previous visit.  No results found for this or any previous visit.  No results found for this or any previous visit.   Assessment & Plan:    1. Erectile dysfunction, unspecified erectile dysfunction type -We will trial daily cailis 5mg  and then 20mg  prn - Urinalysis, Routine w reflex microscopic  2. Nocturia -Continue uroxatral 10mg  qhs   No follow-ups on file.  , MD  [facility]

## 2020-07-09 NOTE — Progress Notes (Signed)
Urological Symptom Review  Patient is experiencing the following symptoms: Frequent urination Get up at night to urinate Leakage of urine Erection problems (male only)   Review of Systems  Gastrointestinal (upper)  : Negative for upper GI symptoms  Gastrointestinal (lower) : Negative for lower GI symptoms  Constitutional : Negative for symptoms  Skin: Negative for skin symptoms  Eyes: Negative for eye symptoms  Ear/Nose/Throat : Negative for Ear/Nose/Throat symptoms  Hematologic/Lymphatic: Negative for Hematologic/Lymphatic symptoms  Cardiovascular : Negative for cardiovascular symptoms  Respiratory : Negative for respiratory symptoms  Endocrine: Negative for endocrine symptoms  Musculoskeletal: Negative for musculoskeletal symptoms  Neurological: Negative for neurological symptoms  Psychologic: Negative for psychiatric symptoms  

## 2020-08-07 ENCOUNTER — Encounter: Payer: Self-pay | Admitting: Family Medicine

## 2020-08-07 ENCOUNTER — Ambulatory Visit: Payer: Medicare PPO | Admitting: Family Medicine

## 2020-08-07 ENCOUNTER — Other Ambulatory Visit: Payer: Self-pay

## 2020-08-07 VITALS — BP 132/86 | HR 67 | Temp 97.7°F | Ht 68.75 in | Wt 186.0 lb

## 2020-08-07 DIAGNOSIS — Z8619 Personal history of other infectious and parasitic diseases: Secondary | ICD-10-CM | POA: Diagnosis not present

## 2020-08-07 DIAGNOSIS — E785 Hyperlipidemia, unspecified: Secondary | ICD-10-CM

## 2020-08-07 DIAGNOSIS — I152 Hypertension secondary to endocrine disorders: Secondary | ICD-10-CM

## 2020-08-07 DIAGNOSIS — N529 Male erectile dysfunction, unspecified: Secondary | ICD-10-CM

## 2020-08-07 DIAGNOSIS — I700 Atherosclerosis of aorta: Secondary | ICD-10-CM | POA: Diagnosis not present

## 2020-08-07 DIAGNOSIS — E1159 Type 2 diabetes mellitus with other circulatory complications: Secondary | ICD-10-CM | POA: Diagnosis not present

## 2020-08-07 DIAGNOSIS — Z0000 Encounter for general adult medical examination without abnormal findings: Secondary | ICD-10-CM | POA: Diagnosis not present

## 2020-08-07 DIAGNOSIS — Z1211 Encounter for screening for malignant neoplasm of colon: Secondary | ICD-10-CM | POA: Diagnosis not present

## 2020-08-07 DIAGNOSIS — E1169 Type 2 diabetes mellitus with other specified complication: Secondary | ICD-10-CM | POA: Diagnosis not present

## 2020-08-07 DIAGNOSIS — G4730 Sleep apnea, unspecified: Secondary | ICD-10-CM

## 2020-08-07 DIAGNOSIS — E118 Type 2 diabetes mellitus with unspecified complications: Secondary | ICD-10-CM

## 2020-08-07 DIAGNOSIS — N3281 Overactive bladder: Secondary | ICD-10-CM

## 2020-08-07 DIAGNOSIS — N1831 Chronic kidney disease, stage 3a: Secondary | ICD-10-CM

## 2020-08-07 LAB — CBC WITH DIFFERENTIAL/PLATELET
Basophils Absolute: 0 10*3/uL (ref 0.0–0.2)
Basos: 1 %
EOS (ABSOLUTE): 0.2 10*3/uL (ref 0.0–0.4)
Eos: 3 %
Hematocrit: 38.9 % (ref 37.5–51.0)
Hemoglobin: 13 g/dL (ref 13.0–17.7)
Immature Grans (Abs): 0 10*3/uL (ref 0.0–0.1)
Immature Granulocytes: 0 %
Lymphocytes Absolute: 2.5 10*3/uL (ref 0.7–3.1)
Lymphs: 44 %
MCH: 30.9 pg (ref 26.6–33.0)
MCHC: 33.4 g/dL (ref 31.5–35.7)
MCV: 92 fL (ref 79–97)
Monocytes Absolute: 0.5 10*3/uL (ref 0.1–0.9)
Monocytes: 8 %
Neutrophils Absolute: 2.6 10*3/uL (ref 1.4–7.0)
Neutrophils: 44 %
Platelets: 268 10*3/uL (ref 150–450)
RBC: 4.21 x10E6/uL (ref 4.14–5.80)
RDW: 12 % (ref 11.6–15.4)
WBC: 5.7 10*3/uL (ref 3.4–10.8)

## 2020-08-07 LAB — COMPREHENSIVE METABOLIC PANEL
ALT: 15 IU/L (ref 0–44)
AST: 20 IU/L (ref 0–40)
Albumin/Globulin Ratio: 1.6 (ref 1.2–2.2)
Albumin: 4.4 g/dL (ref 3.8–4.8)
Alkaline Phosphatase: 88 IU/L (ref 44–121)
BUN/Creatinine Ratio: 14 (ref 10–24)
BUN: 18 mg/dL (ref 8–27)
Bilirubin Total: 0.6 mg/dL (ref 0.0–1.2)
CO2: 23 mmol/L (ref 20–29)
Calcium: 9.7 mg/dL (ref 8.6–10.2)
Chloride: 103 mmol/L (ref 96–106)
Creatinine, Ser: 1.31 mg/dL — ABNORMAL HIGH (ref 0.76–1.27)
GFR calc Af Amer: 64 mL/min/{1.73_m2} (ref >59)
GFR calc non Af Amer: 55 mL/min/{1.73_m2} — ABNORMAL LOW (ref >59)
Globulin, Total: 2.7 g/dL (ref 1.5–4.5)
Glucose: 128 mg/dL — ABNORMAL HIGH (ref 65–99)
Potassium: 4.1 mmol/L (ref 3.5–5.2)
Sodium: 140 mmol/L (ref 134–144)
Total Protein: 7.1 g/dL (ref 6.0–8.5)

## 2020-08-07 LAB — POCT GLYCOSYLATED HEMOGLOBIN (HGB A1C): Hemoglobin A1C: 6.3 % — AB (ref 4.0–5.6)

## 2020-08-07 LAB — LIPID PANEL
Chol/HDL Ratio: 3.7 ratio (ref 0.0–5.0)
Cholesterol, Total: 200 mg/dL — ABNORMAL HIGH (ref 100–199)
HDL: 54 mg/dL (ref >39)
LDL Chol Calc (NIH): 135 mg/dL — ABNORMAL HIGH (ref 0–99)
Triglycerides: 63 mg/dL (ref 0–149)
VLDL Cholesterol Cal: 11 mg/dL (ref 5–40)

## 2020-08-07 LAB — POCT UA - MICROALBUMIN
Albumin/Creatinine Ratio, Urine, POC: 2.1
Creatinine, POC: 316.7 mg/dL
Microalbumin Ur, POC: 6.5 mg/L

## 2020-08-07 MED ORDER — ALFUZOSIN HCL ER 10 MG PO TB24: 10.0000 mg | ORAL_TABLET | Freq: Every day | ORAL | 3 refills | Status: DC

## 2020-08-07 MED ORDER — EZETIMIBE 10 MG PO TABS: 10.0000 mg | ORAL_TABLET | Freq: Every day | ORAL | 3 refills | Status: DC

## 2020-08-07 MED ORDER — ROSUVASTATIN CALCIUM 40 MG PO TABS: 40.0000 mg | ORAL_TABLET | Freq: Every day | ORAL | 3 refills | Status: DC

## 2020-08-07 MED ORDER — METFORMIN HCL 850 MG PO TABS
850.0000 mg | ORAL_TABLET | Freq: Two times a day (BID) | ORAL | 1 refills | Status: AC
Start: 2020-08-07 — End: ?

## 2020-08-07 MED ORDER — OLMESARTAN-AMLODIPINE-HCTZ 20-5-12.5 MG PO TABS: 1.0000 | ORAL_TABLET | Freq: Every day | ORAL | 3 refills | Status: DC

## 2020-08-07 NOTE — Progress Notes (Addendum)
Sharlot Gowda, MD   08/08/2020    Jacob Swanson is a 69 y.o. male who presents for annual wellness visit and follow-up on chronic medical conditions.  He is now semiretired and is enjoying his retirement.  He exercises regularly, does not smoke or drink.  He does have OSA and states it is working well.  He does occasionally use Cialis for his ED which works well.  He does have OAB and is using Uroxatrol as well as finasteride.  He continues on Metformin as well as olmesartan/HCTZ, Crestor and Zetia.  He does occasionally have outbreaks of herpes labialis.  He is having no difficulty with any of these medications.  He keeps his blood sugars in a good range.  He is up-to-date on his immunizations. Family medical history as well as medications and medical history as well as medications and x-rays were reviewed.  Atherosclerosis is noted.  Immunizations and Health Maintenance Immunization History  Administered Date(s) Administered  . Fluad Quad(high Dose 65+) 04/13/2019  . Influenza Whole 07/07/2009, 04/21/2011  . Influenza, High Dose Seasonal PF 05/09/2017, 06/09/2018  . Influenza, Seasonal, Injecte, Preservative Fre 08/08/2012  . Influenza,inj,Quad PF,6+ Mos 08/10/2013, 04/14/2015  . Influenza-Unspecified 04/23/2014, 05/12/2016  . PFIZER SARS-COV-2 Vaccination 09/27/2019, 10/18/2019, 06/07/2020  . Pneumococcal Conjugate-13 09/10/2014  . Pneumococcal Polysaccharide-23 05/19/2005  . Td 04/22/2003  . Tdap 08/08/2012  . Zoster 07/13/2011  . Zoster Recombinat (Shingrix) 05/11/2017, 09/08/2017   Health Maintenance Due  Topic Date Due  . INFLUENZA VACCINE  03/23/2020  . Fecal DNA (Cologuard)  05/22/2020    Last colonoscopy:  05/22/17 Last PSA:  09/07/2018 Dentist: Q year Ophtho: Q year Exercise: work out at [employer] five days a week  Other doctors caring for patient include: Dr. Ronne [facility], Dr. Venita Lick  Advanced Directives: Does Patient Have a Medical Advance Directive?: No Would  patient like information on creating a medical advance directive?: Yes (MAU/Ambulatory/Procedural Areas - Information given)  Depression screen:  See questionnaire below.     Depression screen [facility] 2/9 08/07/2020 03/10/2020 10/31/2019 09/07/2018 05/11/2017  Decreased Interest 0 0 0 0 0  Down, Depressed, Hopeless 0 0 0 0 0  PHQ - 2 Score 0 0 0 0 0  Altered sleeping - - 0 - -  Tired, decreased energy - - 0 - -  Change in appetite - - 0 - -  Feeling bad or failure about yourself  - - 0 - -  Trouble concentrating - - 0 - -  Moving slowly or fidgety/restless - - 0 - -  Suicidal thoughts - - 0 - -  PHQ-9 Score - - 0 - -    Fall Screen: See Questionaire below.   Fall Risk  08/07/2020 03/10/2020 09/07/2018 05/11/2017 01/14/2016  Falls in the past year? 0 0 0 No No  Risk for fall due to : No Fall Risks - - - -    ADL screen:  See questionnaire below.  Functional Status Survey: Is the patient deaf or have difficulty hearing?: Yes (has hearing aide) Does the patient have difficulty seeing, even when wearing glasses/contacts?: No Does the patient have difficulty concentrating, remembering, or making decisions?: No Does the patient have difficulty walking or climbing stairs?: No Does the patient have difficulty dressing or bathing?: No Does the patient have difficulty doing errands alone such as visiting a doctor's office or shopping?: No   Review of Systems  Constitutional: -, -unexpected weight change, -anorexia, -fatigue Allergy: -sneezing, -itching, -congestion Dermatology: denies changing  moles, rash, lumps ENT: -runny nose, -ear pain, -sore throat,  Cardiology:  -chest pain, -palpitations, -orthopnea, Respiratory: -cough, -shortness of breath, -dyspnea on exertion, -wheezing,  Gastroenterology: -abdominal pain, -nausea, -vomiting, -diarrhea, -constipation, -dysphagia Hematology: -bleeding or bruising problems Musculoskeletal: -arthralgias, -myalgias, -joint swelling, -back pain,  - Ophthalmology: -vision changes,  Urology: -dysuria, -difficulty urinating,  -urinary frequency, -urgency, incontinence Neurology: -, -numbness, , -memory loss, -falls, -dizziness    PHYSICAL EXAM:  BP 132/86   Pulse 67   Temp 97.7 F (36.5 C)   Ht 5' 8.75" (1.746 m)   Wt 186 lb (84.4 kg)   SpO2 98%   BMI 27.67 kg/m   General Appearance: Alert, cooperative, no distress, appears stated age Head: Normocephalic, without obvious abnormality, atraumatic Eyes: PERRL, conjunctiva/corneas clear, EOM's intact,  Ears: Normal TM's and external ear canals Nose: Nares normal, mucosa normal, no drainage or sinus   tenderness Throat: Lips, mucosa, and tongue normal; teeth and gums normal Neck: Supple, no lymphadenopathy, thyroid:no enlargement/tenderness/nodules; no carotid bruit or JVD Lungs: Clear to auscultation bilaterally without wheezes, rales or ronchi; respirations unlabored Heart: Regular rate and rhythm, S1 and S2 normal, no murmur, rub or gallop Abdomen: Soft, non-tender, nondistended, normoactive bowel sounds, no masses, no hepatosplenomegaly Extremities: No clubbing, cyanosis or edema Pulses: 2+ and symmetric all extremities.  Diabetic foot exam is recorded and is normal except for questionable diminished pulses Skin: Skin color, texture, turgor normal, no rashes or lesions Lymph nodes: Cervical, supraclavicular, and axillary nodes normal Neurologic: CNII-XII intact, normal strength, sensation and gait; reflexes 2+ and symmetric throughout   Psych: Normal mood, affect, hygiene and grooming Hemoglobin A1c is 6.6. ASSESSMENT/PLAN: Routine general medical examination at a health care facility - Plan: CBC with Differential/Platelet, Comprehensive metabolic panel, Lipid panel  Type 2 diabetes mellitus with complications (HCC) - Plan: CBC with Differential/Platelet, Comprehensive metabolic panel, POCT glycosylated hemoglobin (Hb A1C), POCT UA - Microalbumin, Lipid panel, VAS [place] ABI  WITH/WO TBI, metFORMIN (GLUCOPHAGE) 850 MG tablet  Hyperlipidemia associated with type 2 diabetes mellitus (HCC) - Plan: rosuvastatin (CRESTOR) 40 MG tablet, ezetimibe (ZETIA) 10 MG tablet  Hypertension associated with diabetes (HCC) - Plan: Olmesartan-amLODIPine-HCTZ 20-5-12.5 MG TABS  OAB (overactive bladder) - Plan: alfuzosin (UROXATRAL) 10 MG 24 hr tablet  Erectile dysfunction, unspecified erectile dysfunction type  Sleep apnea, unspecified type  Aortic atherosclerosis (HCC)  H/O herpes labialis  Screening for colon cancer - Plan: Cologuard  Type 2 diabetes mellitus with complication, without long-term current use of insulin (HCC) - Plan: metFORMIN (GLUCOPHAGE) 850 MG tablet  Stage 3a chronic kidney disease (HCC) He is to continue his present medication regimen.  Call when he needs a refill on his Valtrex.  Continue on present medication regimen.    Discussed  recommended at least 30 minutes of aerobic activity at least 5 days/week; healthy diet  reviewed;  Immunization recommendations discussed.  Colonoscopy recommendations reviewed.   Medicare Attestation I have personally reviewed: The patient's medical and social history Their use of alcohol, tobacco or illicit drugs Their current medications and supplements The patient's functional ability including ADLs,fall risks, home safety risks, cognitive, and hearing and visual impairment Diet and physical activities Evidence for depression or mood disorders  The patient's weight, height, and BMI have been recorded in the chart.  I have made referrals, counseling, and provided education to the patient based on review of the above and I have provided the patient with a written personalized care plan for preventive services.     Jacob Swanson  Susann Givens, MD   08/08/2020

## 2020-08-07 NOTE — Patient Instructions (Signed)
  Jacob Swanson , Thank you for taking time to come for your Medicare Wellness Visit. I appreciate your ongoing commitment to your health goals. Please review the following plan we discussed and let me know if I can assist you in the future.   These are the goals we discussed: Continue on your present medications and get a CPAP readout This is a list of the screening recommended for you and due dates:  Health Maintenance  Topic Date Due  . Flu Shot  03/23/2020  . Complete foot exam   05/09/2020  . Cologuard (Stool DNA test)  05/22/2020  . Eye exam for diabetics  09/03/2020  . Hemoglobin A1C  09/19/2020  . Tetanus Vaccine  08/08/2022  . COVID-19 Vaccine  Completed  .  Hepatitis C: One time screening is recommended by Center for Disease Control  (CDC) for  adults born from [birth year] through 1965.   Completed  . Pneumonia vaccines  Discontinued

## 2020-08-11 ENCOUNTER — Other Ambulatory Visit: Payer: Self-pay

## 2020-08-11 ENCOUNTER — Ambulatory Visit (HOSPITAL_COMMUNITY)
Admission: RE | Admit: 2020-08-11 | Discharge: 2020-08-11 | Disposition: A | Discharge status: Home / Self Care | Payer: Medicare PPO | Source: Ambulatory Visit | Attending: Family Medicine | Admitting: Family Medicine

## 2020-08-11 DIAGNOSIS — E118 Type 2 diabetes mellitus with unspecified complications: Secondary | ICD-10-CM | POA: Diagnosis not present

## 2020-08-25 DIAGNOSIS — Z1211 Encounter for screening for malignant neoplasm of colon: Secondary | ICD-10-CM | POA: Diagnosis not present

## 2020-08-27 LAB — COLOGUARD: Cologuard: NEGATIVE

## 2020-09-01 DIAGNOSIS — E119 Type 2 diabetes mellitus without complications: Secondary | ICD-10-CM | POA: Diagnosis not present

## 2020-09-01 DIAGNOSIS — H40013 Open angle with borderline findings, low risk, bilateral: Secondary | ICD-10-CM | POA: Diagnosis not present

## 2020-09-01 DIAGNOSIS — H2513 Age-related nuclear cataract, bilateral: Secondary | ICD-10-CM | POA: Diagnosis not present

## 2020-09-01 DIAGNOSIS — H5213 Myopia, bilateral: Secondary | ICD-10-CM | POA: Diagnosis not present

## 2020-09-01 DIAGNOSIS — H35033 Hypertensive retinopathy, bilateral: Secondary | ICD-10-CM | POA: Diagnosis not present

## 2020-09-01 LAB — HM DIABETES EYE EXAM

## 2020-09-03 LAB — COLOGUARD: COLOGUARD: NEGATIVE

## 2020-09-12 ENCOUNTER — Encounter: Payer: Self-pay | Admitting: Family Medicine

## 2020-12-12 ENCOUNTER — Ambulatory Visit: Payer: Medicare PPO | Admitting: Family Medicine

## 2020-12-12 ENCOUNTER — Encounter: Payer: Self-pay | Admitting: Family Medicine

## 2020-12-12 ENCOUNTER — Other Ambulatory Visit: Payer: Self-pay

## 2020-12-12 VITALS — BP 128/78 | HR 62 | Temp 97.7°F | Ht 68.0 in | Wt 178.0 lb

## 2020-12-12 DIAGNOSIS — I700 Atherosclerosis of aorta: Secondary | ICD-10-CM

## 2020-12-12 DIAGNOSIS — E1169 Type 2 diabetes mellitus with other specified complication: Secondary | ICD-10-CM

## 2020-12-12 DIAGNOSIS — E118 Type 2 diabetes mellitus with unspecified complications: Secondary | ICD-10-CM | POA: Diagnosis not present

## 2020-12-12 DIAGNOSIS — I152 Hypertension secondary to endocrine disorders: Secondary | ICD-10-CM

## 2020-12-12 DIAGNOSIS — E785 Hyperlipidemia, unspecified: Secondary | ICD-10-CM | POA: Diagnosis not present

## 2020-12-12 DIAGNOSIS — Z6379 Other stressful life events affecting family and household: Secondary | ICD-10-CM | POA: Diagnosis not present

## 2020-12-12 DIAGNOSIS — E1159 Type 2 diabetes mellitus with other circulatory complications: Secondary | ICD-10-CM

## 2020-12-12 LAB — POCT GLYCOSYLATED HEMOGLOBIN (HGB A1C): Hemoglobin A1C: 6.8 % — AB (ref 4.0–5.6)

## 2020-12-12 NOTE — Patient Instructions (Signed)
Do not hesitate to call hospice at [phone] for bereavement counseling

## 2020-12-12 NOTE — Progress Notes (Signed)
  Subjective:    Patient ID: Jacob Swanson, male    DOB: 24-Dec-1950, 70 y.o.   MRN: [id number]  Jacob Swanson is a 70 y.o. male who presents for follow-up of Type 2 diabetes mellitus.  Home blood sugar records: not been checking Current symptoms/problems include none at this time. 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  He is the executor of the well and has also been taking care of his father over the last several months.  He seems to have handled this fairly well.  He continues on all the medications listed in the chart and is having no difficulty with any of them.  He does have x-ray evidence of aortic atherosclerosis and is on a statin. The following portions of the patient's history were reviewed and updated as appropriate: allergies, current medications, past medical history, past social history and problem list.  ROS as in subjective above.     Objective:    Physical Exam Alert and in no distress otherwise not examined. Hemoglobin A1c is 6.8.  Lab Review Diabetic Labs Latest Ref Rng & Units 12/12/2020 08/07/2020 03/19/2020 10/31/2019 05/10/2019  HbA1c 4.0 - 5.6 % 6.8(A) 6.3(A) 6.6(A) 6.5(A) 6.5(A)  Microalbumin mg/L - 6.5 - - <5.0  Micro/Creat Ratio - - 2.1 - - <3.1  Chol 100 - 199 mg/dL - 322(G) - - 254  HDL >27 mg/dL - 54 - - 54  Calc LDL 0 - 99 mg/dL - 062(B) - - 762(G)  Triglycerides 0 - 149 mg/dL - 63 - - 315  Creatinine 0.76 - 1.27 mg/dL - 1.76(H) - - 6.07(P)   BP/Weight 12/12/2020 08/07/2020 07/09/2020 03/19/2020 03/10/2020  Systolic BP 128 132 154 110 122  Diastolic BP 78 86 76 78 70  Wt. (Lbs) 178 186 187 182 188.2  BMI 27.06 27.67 27.62 26.88 27.79   Foot/eye exam completion dates Latest Ref Rng & Units 09/01/2020 08/07/2020  Eye Exam No Retinopathy No Retinopathy -  Foot Form Completion - - Done    Jacob Swanson  reports that he has never smoked. He has never used smokeless tobacco. He reports  current alcohol use. He reports that he does not use drugs.     Assessment & Plan:    Type 2 diabetes mellitus with complications (HCC) - Plan: POCT glycosylated hemoglobin (Hb A1C)  Hyperlipidemia associated with type 2 diabetes mellitus (HCC)  Hypertension associated with diabetes (HCC)  Aortic atherosclerosis (HCC)  Stress due to illness of family member   1. Rx changes: none 2. Education: Reviewed 'ABCs' of diabetes management (respective goals in parentheses):  A1C (<7), blood pressure (<130/80), and cholesterol (LDL <100). 3. Compliance at present is estimated to be good. Efforts to improve compliance (if necessary) will be directed at Continue on present medication regimen. 4. Follow up: 6 months  Encouraged him to contact hospice for bereavement counseling.  Continue on his present medication regimen and I will see him again in roughly 6 months.

## 2021-01-06 ENCOUNTER — Ambulatory Visit: Payer: Medicare PPO | Admitting: Urology

## 2021-03-23 DIAGNOSIS — H40013 Open angle with borderline findings, low risk, bilateral: Secondary | ICD-10-CM | POA: Diagnosis not present

## 2021-03-23 LAB — HM DIABETES EYE EXAM

## 2021-05-20 DIAGNOSIS — I700 Atherosclerosis of aorta: Secondary | ICD-10-CM | POA: Diagnosis not present

## 2021-05-20 DIAGNOSIS — G8929 Other chronic pain: Secondary | ICD-10-CM | POA: Diagnosis not present

## 2021-05-20 DIAGNOSIS — I129 Hypertensive chronic kidney disease with stage 1 through stage 4 chronic kidney disease, or unspecified chronic kidney disease: Secondary | ICD-10-CM | POA: Diagnosis not present

## 2021-05-20 DIAGNOSIS — E1159 Type 2 diabetes mellitus with other circulatory complications: Secondary | ICD-10-CM | POA: Diagnosis not present

## 2021-05-20 DIAGNOSIS — G4730 Sleep apnea, unspecified: Secondary | ICD-10-CM | POA: Diagnosis not present

## 2021-05-20 DIAGNOSIS — E785 Hyperlipidemia, unspecified: Secondary | ICD-10-CM | POA: Diagnosis not present

## 2021-05-20 DIAGNOSIS — E669 Obesity, unspecified: Secondary | ICD-10-CM | POA: Diagnosis not present

## 2021-05-20 DIAGNOSIS — E1122 Type 2 diabetes mellitus with diabetic chronic kidney disease: Secondary | ICD-10-CM | POA: Diagnosis not present

## 2021-05-20 DIAGNOSIS — E1136 Type 2 diabetes mellitus with diabetic cataract: Secondary | ICD-10-CM | POA: Diagnosis not present

## 2021-06-09 ENCOUNTER — Ambulatory Visit (INDEPENDENT_AMBULATORY_CARE_PROVIDER_SITE_OTHER): Payer: Medicare PPO | Admitting: Family Medicine

## 2021-06-09 ENCOUNTER — Other Ambulatory Visit: Payer: Self-pay

## 2021-06-09 VITALS — BP 140/82 | HR 74 | Temp 97.8°F | Wt 186.2 lb

## 2021-06-09 DIAGNOSIS — E785 Hyperlipidemia, unspecified: Secondary | ICD-10-CM

## 2021-06-09 DIAGNOSIS — G4730 Sleep apnea, unspecified: Secondary | ICD-10-CM | POA: Diagnosis not present

## 2021-06-09 DIAGNOSIS — J029 Acute pharyngitis, unspecified: Secondary | ICD-10-CM | POA: Diagnosis not present

## 2021-06-09 DIAGNOSIS — H409 Unspecified glaucoma: Secondary | ICD-10-CM | POA: Diagnosis not present

## 2021-06-09 DIAGNOSIS — E1169 Type 2 diabetes mellitus with other specified complication: Secondary | ICD-10-CM | POA: Diagnosis not present

## 2021-06-09 DIAGNOSIS — E1159 Type 2 diabetes mellitus with other circulatory complications: Secondary | ICD-10-CM

## 2021-06-09 DIAGNOSIS — N3281 Overactive bladder: Secondary | ICD-10-CM

## 2021-06-09 DIAGNOSIS — E118 Type 2 diabetes mellitus with unspecified complications: Secondary | ICD-10-CM | POA: Diagnosis not present

## 2021-06-09 DIAGNOSIS — I700 Atherosclerosis of aorta: Secondary | ICD-10-CM

## 2021-06-09 DIAGNOSIS — H259 Unspecified age-related cataract: Secondary | ICD-10-CM | POA: Insufficient documentation

## 2021-06-09 DIAGNOSIS — N529 Male erectile dysfunction, unspecified: Secondary | ICD-10-CM | POA: Diagnosis not present

## 2021-06-09 DIAGNOSIS — I152 Hypertension secondary to endocrine disorders: Secondary | ICD-10-CM

## 2021-06-09 LAB — POC COVID19 BINAXNOW: SARS Coronavirus 2 Ag: NEGATIVE

## 2021-06-09 LAB — POCT GLYCOSYLATED HEMOGLOBIN (HGB A1C): Hemoglobin A1C: 7 % — AB (ref 4.0–5.6)

## 2021-06-09 LAB — POCT INFLUENZA A/B
Influenza A, POC: NEGATIVE
Influenza B, POC: NEGATIVE

## 2021-06-09 NOTE — Progress Notes (Signed)
Subjective:    Patient ID: Jacob Swanson, male    DOB: Jul 04, 1951, 70 y.o.   MRN: [id number]  Jacob Swanson is a 70 y.o. male who presents for follow-up of Type 2 diabetes mellitus.  Home blood sugar records:  fasting and post meal lowest - 150   highest  -176 Current symptoms/problems include none and have been stable. Daily foot checks:yes    Any foot concerns: none  Exercise:  work out at [employer]  daily Diet: good  He also complains of a slight sore throat but no fever, chills, cough or congestion. He continues on metformin and having no difficulty with that.  He is also taking Crestor and Zetia with no side effects.  He does have a history of aortic atherosclerosis.  Continues on olmesartan/amlodipine.  He continues on Uroxatrol and states that it is helping with his OAB symptoms.  He is taking Cialis 5 mg and supplementing it with 20 mg of Cialis on an as-needed basis.  He is getting good results with his CPAP machine and is happy with that.  He is followed by ophthalmology for glaucoma as well as cataracts.  He has been taking care of his father who recently died.  That has interfered with his diet and exercise regimen. The following portions of the patient's history were reviewed and updated as appropriate: allergies, current medications, past medical history, past social history and problem list.  ROS as in subjective above.     Objective:    Physical Exam Alert and in no distress prep and COVID screen was negative.  Hemoglobin A1c is 7.0  Lab Review Diabetic Labs Latest Ref Rng & Units 12/12/2020 08/07/2020 03/19/2020 10/31/2019 05/10/2019  HbA1c 4.0 - 5.6 % 6.8(A) 6.3(A) 6.6(A) 6.5(A) 6.5(A)  Microalbumin mg/L - 6.5 - - <5.0  Micro/Creat Ratio - - 2.1 - - <3.1  Chol 100 - 199 mg/dL - 829(H) - - 371  HDL >69 mg/dL - 54 - - 54  Calc LDL 0 - 99 mg/dL - 678(L) - - 381(O)  Triglycerides 0 - 149 mg/dL - 63 - - 175  Creatinine 0.76 - 1.27 mg/dL - 1.02(H) - - 8.52(D)   BP/Weight  12/12/2020 08/07/2020 07/09/2020 03/19/2020 03/10/2020  Systolic BP 128 132 154 110 122  Diastolic BP 78 86 76 78 70  Wt. (Lbs) 178 186 187 182 188.2  BMI 27.06 27.67 27.62 26.88 27.79   Foot/eye exam completion dates Latest Ref Rng & Units 03/23/2021 09/01/2020  Eye Exam No Retinopathy Retinopathy(A) No Retinopathy  Foot Form Completion - - -    Jacob Swanson  reports that he has never smoked. He has never used smokeless tobacco. He reports current alcohol use. He reports that he does not use drugs.     Assessment & Plan:    Type 2 diabetes mellitus with complications (HCC) - Plan: POCT glycosylated hemoglobin (Hb A1C)  Hyperlipidemia associated with type 2 diabetes mellitus (HCC)  Hypertension associated with diabetes (HCC)  OAB (overactive bladder)  Erectile dysfunction, unspecified erectile dysfunction type  Sleep apnea, unspecified type  Glaucoma, unspecified glaucoma type, unspecified laterality  Aortic atherosclerosis (HCC)  Age-related cataract of both eyes, unspecified age-related cataract type  Sore throat - Plan: POC COVID-19 BinaxNow, POCT Influenza A/B  Rx changes: none Education: Reviewed 'ABCs' of diabetes management (respective goals in parentheses):  A1C (<7), blood pressure (<130/80), and cholesterol (LDL <100). Compliance at present is estimated to be good. Efforts to improve compliance (if necessary) will be directed at  no change . Follow up: 4 months Encouraged him to continue on his present diet regimen and increase his physical activity back to his normal levels.  Complete exam and diabetes recheck in 4 months.

## 2021-08-11 ENCOUNTER — Other Ambulatory Visit: Payer: Self-pay

## 2021-08-11 ENCOUNTER — Ambulatory Visit: Payer: Medicare PPO | Admitting: Family Medicine

## 2021-08-11 VITALS — BP 130/76 | HR 78 | Temp 98.4°F | Ht 68.0 in | Wt 180.4 lb

## 2021-08-11 DIAGNOSIS — Z125 Encounter for screening for malignant neoplasm of prostate: Secondary | ICD-10-CM | POA: Diagnosis not present

## 2021-08-11 DIAGNOSIS — E118 Type 2 diabetes mellitus with unspecified complications: Secondary | ICD-10-CM | POA: Diagnosis not present

## 2021-08-11 DIAGNOSIS — I700 Atherosclerosis of aorta: Secondary | ICD-10-CM

## 2021-08-11 DIAGNOSIS — Z0000 Encounter for general adult medical examination without abnormal findings: Secondary | ICD-10-CM | POA: Diagnosis not present

## 2021-08-11 DIAGNOSIS — N529 Male erectile dysfunction, unspecified: Secondary | ICD-10-CM

## 2021-08-11 DIAGNOSIS — E1159 Type 2 diabetes mellitus with other circulatory complications: Secondary | ICD-10-CM

## 2021-08-11 DIAGNOSIS — H409 Unspecified glaucoma: Secondary | ICD-10-CM

## 2021-08-11 DIAGNOSIS — I152 Hypertension secondary to endocrine disorders: Secondary | ICD-10-CM | POA: Diagnosis not present

## 2021-08-11 DIAGNOSIS — E785 Hyperlipidemia, unspecified: Secondary | ICD-10-CM | POA: Diagnosis not present

## 2021-08-11 DIAGNOSIS — N3281 Overactive bladder: Secondary | ICD-10-CM | POA: Diagnosis not present

## 2021-08-11 DIAGNOSIS — G4730 Sleep apnea, unspecified: Secondary | ICD-10-CM | POA: Diagnosis not present

## 2021-08-11 DIAGNOSIS — E1121 Type 2 diabetes mellitus with diabetic nephropathy: Secondary | ICD-10-CM

## 2021-08-11 DIAGNOSIS — E1169 Type 2 diabetes mellitus with other specified complication: Secondary | ICD-10-CM | POA: Diagnosis not present

## 2021-08-11 DIAGNOSIS — H259 Unspecified age-related cataract: Secondary | ICD-10-CM | POA: Diagnosis not present

## 2021-08-11 DIAGNOSIS — R972 Elevated prostate specific antigen [PSA]: Secondary | ICD-10-CM | POA: Diagnosis not present

## 2021-08-11 LAB — POCT UA - MICROALBUMIN
Albumin/Creatinine Ratio, Urine, POC: 4.5
Creatinine, POC: 249.4 mg/dL
Microalbumin Ur, POC: 11.1 mg/L

## 2021-08-11 LAB — POCT GLYCOSYLATED HEMOGLOBIN (HGB A1C): Hemoglobin A1C: 7.5 % — AB (ref 4.0–5.6)

## 2021-08-11 NOTE — Progress Notes (Signed)
Jacob Swanson is a 70 y.o. male who presents for annual wellness visit ,CPE and follow-up on chronic medical conditions.  He has no particular concerns or complaints.  He continues to take excellent care of himself. He continues on his blood pressure medication as well as metformin and having no difficulty with that.  He is also taking Crestor and Zetia and having no aches and pains.  He gets good results using Cialis for his underlying ED. continues on Uroxatrol but states that he is actually getting up at least 4 times per night to urinate.  He does have OSA but does not use a CPAP on a regular basis but does state that he is doing well and not having difficulty with excessive fatigue.  He does see ophthalmology regularly for his cataract as well as glaucoma.  Does check his feet regularly.  Does have x-ray evidence of aortic atherosclerosis.  Family and social history as well as health maintenance and immunizations was reviewed.  Immunizations and Health Maintenance Immunization History  Administered Date(s) Administered   Fluad Quad(high Dose 65+) 04/13/2019   Influenza Whole 07/07/2009, 04/21/2011   Influenza, High Dose Seasonal PF 05/09/2017, 06/09/2018   Influenza, Seasonal, Injecte, Preservative Fre 08/08/2012   Influenza,inj,Quad PF,6+ Mos 08/10/2013, 04/14/2015   Influenza-Unspecified 04/23/2014, 05/12/2016   PFIZER(Purple Top)SARS-COV-2 Vaccination 09/27/2019, 10/18/2019, 06/07/2020   Pneumococcal Conjugate-13 09/10/2014   Pneumococcal Polysaccharide-23 05/19/2005   Td 04/22/2003   Tdap 08/08/2012   Zoster Recombinat (Shingrix) 05/11/2017, 09/08/2017   Zoster, Live 07/13/2011   There are no preventive care reminders to display for this patient.   Last colonoscopy: 05/30/2006 cologuard 08/25/20 negative Last PSA: 09/07/18 (1.9) Dentist:Q six months Ophtho: Q year Dr. Elmer Picker Exercise: silver sneaker 5-6- days a week   Other doctors caring for patient include: Dr. Elmer Picker eye             Dr. Elnoria Howard GI            Dr. Gala Romney cardiology            Dr. Ronne Binning urology             Dr. Jearld Fenton ENT Advanced Directives: Does Patient Have a Medical Advance Directive?: No Would patient like information on creating a medical advance directive?: Yes (ED - Information included in AVS)  Depression screen:  See questionnaire below.     Depression screen [facility] 2/9 08/11/2021 08/07/2020 03/10/2020 10/31/2019 09/07/2018  Decreased Interest 0 0 0 0 0  Down, Depressed, Hopeless 0 0 0 0 0  PHQ - 2 Score 0 0 0 0 0  Altered sleeping - - - 0 -  Tired, decreased energy - - - 0 -  Change in appetite - - - 0 -  Feeling bad or failure about yourself  - - - 0 -  Trouble concentrating - - - 0 -  Moving slowly or fidgety/restless - - - 0 -  Suicidal thoughts - - - 0 -  PHQ-9 Score - - - 0 -    Fall Screen: See Questionaire below.   Fall Risk  08/11/2021 08/07/2020 03/10/2020 09/07/2018 05/11/2017  Falls in the past year? 0 0 0 0 No  Number falls in past yr: 0 - - - -  Injury with Fall? 0 - - - -  Risk for fall due to : No Fall Risks No Fall Risks - - -  Follow up Falls evaluation completed - - - -    ADL screen:  See questionnaire below.  Functional Status Survey: Is the patient deaf or have difficulty hearing?: Yes Does the patient have difficulty seeing, even when wearing glasses/contacts?: No Does the patient have difficulty concentrating, remembering, or making decisions?: No Does the patient have difficulty walking or climbing stairs?: No Does the patient have difficulty dressing or bathing?: No Does the patient have difficulty doing errands alone such as visiting a doctor's office or shopping?: No   Review of Systems  Constitutional: -, -unexpected weight change, -anorexia, -fatigue Allergy: -sneezing, -itching, -congestion Dermatology: denies changing moles, rash, lumps ENT: -runny nose, -ear pain, -sore throat,  Cardiology:  -chest pain, -palpitations, -orthopnea, Respiratory:  -cough, -shortness of breath, -dyspnea on exertion, -wheezing,  Gastroenterology: -abdominal pain, -nausea, -vomiting, -diarrhea, -constipation, -dysphagia Hematology: -bleeding or bruising problems Musculoskeletal: -arthralgias, -myalgias, -joint swelling, -back pain, - Ophthalmology: -vision changes,  Urology: -dysuria, -difficulty urinating,  -urinary frequency, -urgency, incontinence Neurology: -, -numbness, , -memory loss, -falls, -dizziness    PHYSICAL EXAM:  General Appearance: Alert, cooperative, no distress, appears stated age Head: Normocephalic, without obvious abnormality, atraumatic Eyes: PERRL, conjunctiva/corneas clear, EOM's intact,  Ears: Normal TM's and external ear canals Nose: Nares normal, mucosa normal, no drainage or sinus   tenderness Throat: Lips, mucosa, and tongue normal; teeth and gums normal Neck: Supple, no lymphadenopathy, thyroid:no enlargement/tenderness/nodules; no carotid bruit or JVD Lungs: Clear to auscultation bilaterally without wheezes, rales or ronchi; respirations unlabored Heart: Regular rate and rhythm, S1 and S2 normal, no murmur, rub or gallop Abdomen: Soft, non-tender, nondistended, normoactive bowel sounds, no masses, no hepatosplenomegaly Extremities: No clubbing, cyanosis or edema diabetic foot exam is normal Pulses: 2+ and symmetric all extremities Skin: Skin color, texture, turgor normal, no rashes or lesions Lymph nodes: Cervical, supraclavicular, and axillary nodes normal Neurologic: CNII-XII intact, normal strength, sensation and gait; reflexes 2+ and symmetric throughout   Psych: Normal mood, affect, hygiene and grooming Hemoglobin A1c is 7.5 ASSESSMENT/PLAN: Routine general medical examination at a health care facility - Plan: CBC with Differential/Platelet, Comprehensive metabolic panel, Lipid panel  Type 2 diabetes mellitus with complications (HCC) - Plan: CBC with Differential/Platelet, Comprehensive metabolic panel, Lipid  panel, POCT glycosylated hemoglobin (Hb A1C), POCT UA - Microalbumin  Hyperlipidemia associated with type 2 diabetes mellitus (HCC) - Plan: Lipid panel  Hypertension associated with diabetes (HCC) - Plan: CBC with Differential/Platelet, Comprehensive metabolic panel  OAB (overactive bladder)  Erectile dysfunction, unspecified erectile dysfunction type  Sleep apnea, unspecified type  Age-related cataract of both eyes, unspecified age-related cataract type  Aortic atherosclerosis (HCC) - Plan: Lipid panel  Glaucoma, unspecified glaucoma type, unspecified laterality  Diabetic nephropathy associated with type 2 diabetes mellitus (HCC)  Screening for prostate cancer - Plan: PSA Continue to follow-up with ophthalmology and recommend he call urology to follow-up on his overactive bladder symptoms.  Continue on present medication regimen.  Recommended he use his CPAP regularly rather than on a as needed basis.   Discussed PSA screening (risks/benefits), recommended at least 30 minutes of aerobic activity at least 5 days/week;  reviewed; healthy diet and alcohol recommendations (less than or equal to 2 drinks/day) reviewed;  Immunization recommendations discussed.  Colonoscopy recommendations reviewed.   Medicare Attestation I have personally reviewed: The patient's medical and social history Their use of alcohol, tobacco or illicit drugs Their current medications and supplements The patient's functional ability including ADLs,fall risks, home safety risks, cognitive, and hearing and visual impairment Diet and physical activities Evidence for depression or mood disorders  The patient's weight, height, and BMI have been  recorded in the chart.  I have made referrals, counseling, and provided education to the patient based on review of the above and I have provided the patient with a written personalized care plan for preventive services.     Sharlot Gowda, MD   08/11/2021

## 2021-08-11 NOTE — Patient Instructions (Signed)
°  Jacob Swanson , Thank you for taking time to come for your Medicare Wellness Visit. I appreciate your ongoing commitment to your health goals. Please review the following plan we discussed and let me know if I can assist you in the future.   These are the goals we discussed:  Goals   None     This is a list of the screening recommended for you and due dates:  Health Maintenance  Topic Date Due   Pneumonia Vaccine (3 - PPSV23 if available, else PCV20) 09/11/2015   COVID-19 Vaccine (4 - Booster for Pfizer series) 08/02/2020   Flu Shot  03/23/2021   Complete foot exam   08/07/2021   Hemoglobin A1C  12/08/2021   Eye exam for diabetics  03/23/2022   Tetanus Vaccine  08/08/2022   Cologuard (Stool DNA test)  08/28/2023   Hepatitis C Screening: USPSTF Recommendation to screen - Ages 18-79 yo.  Completed   Zoster (Shingles) Vaccine  Completed   HPV Vaccine  Aged Out

## 2021-08-12 DIAGNOSIS — N3281 Overactive bladder: Secondary | ICD-10-CM | POA: Diagnosis not present

## 2021-08-12 DIAGNOSIS — H409 Unspecified glaucoma: Secondary | ICD-10-CM | POA: Diagnosis not present

## 2021-08-12 DIAGNOSIS — G4733 Obstructive sleep apnea (adult) (pediatric): Secondary | ICD-10-CM | POA: Diagnosis not present

## 2021-08-12 DIAGNOSIS — I700 Atherosclerosis of aorta: Secondary | ICD-10-CM | POA: Diagnosis not present

## 2021-08-12 DIAGNOSIS — E785 Hyperlipidemia, unspecified: Secondary | ICD-10-CM | POA: Diagnosis not present

## 2021-08-12 DIAGNOSIS — E663 Overweight: Secondary | ICD-10-CM | POA: Diagnosis not present

## 2021-08-12 DIAGNOSIS — E1121 Type 2 diabetes mellitus with diabetic nephropathy: Secondary | ICD-10-CM | POA: Diagnosis not present

## 2021-08-12 DIAGNOSIS — H269 Unspecified cataract: Secondary | ICD-10-CM | POA: Diagnosis not present

## 2021-08-12 DIAGNOSIS — I10 Essential (primary) hypertension: Secondary | ICD-10-CM | POA: Diagnosis not present

## 2021-08-12 LAB — LIPID PANEL
Chol/HDL Ratio: 4.3 ratio (ref 0.0–5.0)
Cholesterol, Total: 204 mg/dL — ABNORMAL HIGH (ref 100–199)
HDL: 47 mg/dL (ref >39)
LDL Chol Calc (NIH): 142 mg/dL — ABNORMAL HIGH (ref 0–99)
Triglycerides: 82 mg/dL (ref 0–149)
VLDL Cholesterol Cal: 15 mg/dL (ref 5–40)

## 2021-08-12 LAB — COMPREHENSIVE METABOLIC PANEL
ALT: 53 IU/L — ABNORMAL HIGH (ref 0–44)
AST: 85 IU/L — ABNORMAL HIGH (ref 0–40)
Albumin/Globulin Ratio: 1.5 (ref 1.2–2.2)
Albumin: 4.5 g/dL (ref 3.8–4.8)
Alkaline Phosphatase: 116 IU/L (ref 44–121)
BUN/Creatinine Ratio: 16 (ref 10–24)
BUN: 21 mg/dL (ref 8–27)
Bilirubin Total: 0.5 mg/dL (ref 0.0–1.2)
CO2: 26 mmol/L (ref 20–29)
Calcium: 10 mg/dL (ref 8.6–10.2)
Chloride: 98 mmol/L (ref 96–106)
Creatinine, Ser: 1.35 mg/dL — ABNORMAL HIGH (ref 0.76–1.27)
Globulin, Total: 3.1 g/dL (ref 1.5–4.5)
Glucose: 161 mg/dL — ABNORMAL HIGH (ref 70–99)
Potassium: 4.4 mmol/L (ref 3.5–5.2)
Sodium: 138 mmol/L (ref 134–144)
Total Protein: 7.6 g/dL (ref 6.0–8.5)
eGFR: 56 mL/min/{1.73_m2} — ABNORMAL LOW (ref >59)

## 2021-08-12 LAB — CBC WITH DIFFERENTIAL/PLATELET
Basophils Absolute: 0 x10E3/uL (ref 0.0–0.2)
Basos: 0 %
EOS (ABSOLUTE): 0.2 x10E3/uL (ref 0.0–0.4)
Eos: 2 %
Hematocrit: 38.9 % (ref 37.5–51.0)
Hemoglobin: 13.2 g/dL (ref 13.0–17.7)
Immature Grans (Abs): 0 x10E3/uL (ref 0.0–0.1)
Immature Granulocytes: 0 %
Lymphocytes Absolute: 2 x10E3/uL (ref 0.7–3.1)
Lymphs: 22 %
MCH: 30.3 pg (ref 26.6–33.0)
MCHC: 33.9 g/dL (ref 31.5–35.7)
MCV: 89 fL (ref 79–97)
Monocytes Absolute: 0.7 x10E3/uL (ref 0.1–0.9)
Monocytes: 7 %
Neutrophils Absolute: 6.5 x10E3/uL (ref 1.4–7.0)
Neutrophils: 69 %
Platelets: 270 x10E3/uL (ref 150–450)
RBC: 4.36 x10E6/uL (ref 4.14–5.80)
RDW: 12.3 % (ref 11.6–15.4)
WBC: 9.3 x10E3/uL (ref 3.4–10.8)

## 2021-08-12 LAB — PSA: Prostate Specific Ag, Serum: 4.5 ng/mL — ABNORMAL HIGH (ref 0.0–4.0)

## 2021-08-13 LAB — SPECIMEN STATUS REPORT

## 2021-08-13 LAB — PSA TOTAL (REFLEX TO FREE): Prostate Specific Ag, Serum: 4.5 ng/mL — ABNORMAL HIGH (ref 0.0–4.0)

## 2021-08-13 LAB — FPSA% REFLEX
% FREE PSA: 10.7 %
PSA, FREE: 0.48 ng/mL

## 2021-08-13 NOTE — Addendum Note (Signed)
Addended by: Ronnald Nian on: 08/13/2021 09:41 AM   Modules accepted: Orders

## 2021-08-31 ENCOUNTER — Ambulatory Visit
Admission: RE | Admit: 2021-08-31 | Discharge: 2021-08-31 | Disposition: A | Discharge status: Home / Self Care | Payer: Medicare PPO | Source: Ambulatory Visit | Attending: Family Medicine | Admitting: Family Medicine

## 2021-08-31 ENCOUNTER — Ambulatory Visit: Payer: Medicare PPO | Admitting: Family Medicine

## 2021-08-31 ENCOUNTER — Other Ambulatory Visit: Payer: Self-pay

## 2021-08-31 VITALS — BP 98/60 | HR 102 | Temp 98.5°F | Wt 183.0 lb

## 2021-08-31 DIAGNOSIS — M25561 Pain in right knee: Secondary | ICD-10-CM | POA: Diagnosis not present

## 2021-08-31 DIAGNOSIS — M25461 Effusion, right knee: Secondary | ICD-10-CM | POA: Diagnosis not present

## 2021-08-31 NOTE — Progress Notes (Signed)
° °  Subjective:    Patient ID: Jacob Jacob, male    DOB: Oct 09, 1950, 71 y.o.   MRN: [id number]  HPI He states that on Friday he got done working out when she was doing some leg exercises and shortly after that developed some slight discomfort and swelling in the right knee.  No popping locking or grinding.  He did use ice and some topical Voltaren over the weekend.  No other joints involved.  He has had no recent sexual activity.   Review of Systems     Objective:   Physical Exam Right knee exam does show a minimal effusion but it is not hot or tender.  No tenderness over medial or lateral joint line.  Negative anterior drawer.  McMurray's testing was slightly uncomfortable laterally.  Medial and lateral collateral ligaments intact.       Assessment & Plan:  Effusion of right knee - Plan: DG Knee Complete 4 Views Right Take 2 Aleve twice per day.Marland Kitchen  Heat for 20 minutes 3 times per day  I explained that at this time there is no clear-cut explanation as to why he is having this.  We will treat this conservatively and have him come back in 2 weeks for a recheck.

## 2021-08-31 NOTE — Patient Instructions (Addendum)
Take 2 Aleve twice per day.  Heat for 20 minutes 3 times per day 

## 2021-09-06 DIAGNOSIS — G4730 Sleep apnea, unspecified: Secondary | ICD-10-CM | POA: Diagnosis not present

## 2021-09-06 DIAGNOSIS — Z809 Family history of malignant neoplasm, unspecified: Secondary | ICD-10-CM | POA: Diagnosis not present

## 2021-09-06 DIAGNOSIS — E1136 Type 2 diabetes mellitus with diabetic cataract: Secondary | ICD-10-CM | POA: Diagnosis not present

## 2021-09-06 DIAGNOSIS — I10 Essential (primary) hypertension: Secondary | ICD-10-CM | POA: Diagnosis not present

## 2021-09-06 DIAGNOSIS — Z7984 Long term (current) use of oral hypoglycemic drugs: Secondary | ICD-10-CM | POA: Diagnosis not present

## 2021-09-06 DIAGNOSIS — Z8249 Family history of ischemic heart disease and other diseases of the circulatory system: Secondary | ICD-10-CM | POA: Diagnosis not present

## 2021-09-06 DIAGNOSIS — M1990 Unspecified osteoarthritis, unspecified site: Secondary | ICD-10-CM | POA: Diagnosis not present

## 2021-09-06 DIAGNOSIS — N529 Male erectile dysfunction, unspecified: Secondary | ICD-10-CM | POA: Diagnosis not present

## 2021-09-06 DIAGNOSIS — G8929 Other chronic pain: Secondary | ICD-10-CM | POA: Diagnosis not present

## 2021-09-10 ENCOUNTER — Other Ambulatory Visit: Payer: Self-pay | Admitting: Family Medicine

## 2021-09-10 DIAGNOSIS — E1159 Type 2 diabetes mellitus with other circulatory complications: Secondary | ICD-10-CM

## 2021-09-11 ENCOUNTER — Other Ambulatory Visit: Payer: Self-pay

## 2021-09-11 ENCOUNTER — Other Ambulatory Visit: Payer: Medicare PPO

## 2021-09-11 DIAGNOSIS — Z125 Encounter for screening for malignant neoplasm of prostate: Secondary | ICD-10-CM | POA: Diagnosis not present

## 2021-09-12 LAB — PSA, TOTAL AND FREE
PSA, Free Pct: 11.9 %
PSA, Free: 0.44 ng/mL
Prostate Specific Ag, Serum: 3.7 ng/mL (ref 0.0–4.0)

## 2021-09-14 ENCOUNTER — Ambulatory Visit: Payer: Medicare PPO | Admitting: Family Medicine

## 2021-09-14 ENCOUNTER — Other Ambulatory Visit: Payer: Self-pay

## 2021-09-14 ENCOUNTER — Encounter: Payer: Self-pay | Admitting: Family Medicine

## 2021-09-14 VITALS — BP 138/78 | HR 85 | Temp 98.5°F | Wt 184.6 lb

## 2021-09-14 DIAGNOSIS — M25461 Effusion, right knee: Secondary | ICD-10-CM | POA: Diagnosis not present

## 2021-09-14 DIAGNOSIS — R972 Elevated prostate specific antigen [PSA]: Secondary | ICD-10-CM

## 2021-09-14 NOTE — Progress Notes (Signed)
° °  Subjective:    Patient ID: Jacob Swanson, male    DOB: 11-Apr-1951, 71 y.o.   MRN: [id number]  HPI He is here for recheck.  He states that he is roughly 60% better and now only having occasional discomfort.  He also recently had a PSA done and wants to discuss this.   Review of Systems     Objective:   Physical Exam Right knee exam does show an effusion present.  No tenderness over the joint line.  Anterior drawer and McMurray's testing negative.       Assessment & Plan:  Effusion of right knee  Elevated PSA Continue with 2 Aleve twice per day for the next couple of weeks.  Keep doing the heat for a couple of times per day  I then discussed the PSA with him.  Explained that his risk is roughly 35%.  He plans to discuss this further with Dr. Ronne Binning.  I will send lab data to him

## 2021-09-14 NOTE — Patient Instructions (Signed)
Continue with 2 Aleve twice per day for the next couple of weeks.  Keep doing the heat for a couple of times per day

## 2021-09-23 ENCOUNTER — Encounter: Payer: Self-pay | Admitting: Urology

## 2021-09-23 ENCOUNTER — Other Ambulatory Visit: Payer: Self-pay

## 2021-09-23 ENCOUNTER — Ambulatory Visit: Payer: Medicare PPO | Admitting: Urology

## 2021-09-23 VITALS — BP 136/63 | HR 82

## 2021-09-23 DIAGNOSIS — R351 Nocturia: Secondary | ICD-10-CM | POA: Diagnosis not present

## 2021-09-23 DIAGNOSIS — N138 Other obstructive and reflux uropathy: Secondary | ICD-10-CM

## 2021-09-23 DIAGNOSIS — N401 Enlarged prostate with lower urinary tract symptoms: Secondary | ICD-10-CM

## 2021-09-23 DIAGNOSIS — N529 Male erectile dysfunction, unspecified: Secondary | ICD-10-CM | POA: Diagnosis not present

## 2021-09-23 DIAGNOSIS — N3281 Overactive bladder: Secondary | ICD-10-CM

## 2021-09-23 MED ORDER — ALFUZOSIN HCL ER 10 MG PO TB24: 10.0000 mg | ORAL_TABLET | Freq: Every day | ORAL | 3 refills | Status: DC

## 2021-09-23 MED ORDER — TADALAFIL 5 MG PO TABS
5.0000 mg | ORAL_TABLET | Freq: Every day | ORAL | 11 refills | Status: DC
Start: 2021-09-23 — End: 2023-10-12

## 2021-09-23 MED ORDER — TADALAFIL 20 MG PO TABS: 20.0000 mg | ORAL_TABLET | ORAL | 3 refills | Status: DC | PRN

## 2021-09-23 NOTE — Patient Instructions (Signed)

## 2021-09-23 NOTE — Progress Notes (Signed)
Urological Symptom Review  Patient is experiencing the following symptoms: Frequent urination Get up at night to urinate Erection problems (male only)   Review of Systems  Gastrointestinal (upper)  : Negative for upper GI symptoms  Gastrointestinal (lower) : Negative for lower GI symptoms  Constitutional : Negative for symptoms  Skin: Negative for skin symptoms  Eyes: Negative for eye symptoms  Ear/Nose/Throat : Negative for Ear/Nose/Throat symptoms  Hematologic/Lymphatic: Negative for Hematologic/Lymphatic symptoms  Cardiovascular : Leg swelling  Respiratory : Negative for respiratory symptoms  Endocrine: Negative for endocrine symptoms  Musculoskeletal: Joint pain  Neurological: Negative for neurological symptoms  Psychologic: Negative for psychiatric symptoms

## 2021-09-23 NOTE — Progress Notes (Signed)
09/23/2021 1:33 PM   Jacob Swanson 10/08/1950 [id number]  Referring provider: Ronnald Nian, MD [address] [place],  [place] [address]  Followup BPH and erectile dysfunction   HPI: Jacob Swanson isa 9NA here for followup for BPH with nocturia and erectile dysfunction. IPSS 6 QOL 2. PSA 3.7. Urine stream strong. Nocturia 3x on uroxatral 10mg  qhs. He continues to have issues maintaining an erection. He take cialis 5mg  3 days per week. He is able to get a firm erection but loses his erection after penetration.   PMH: Past Medical History:  Diagnosis Date   Diabetes mellitus    Dyslipidemia    ED (erectile dysfunction)    Hemorrhoids    Herpes genitalis in men    Hyperlipidemia    Hypertension    Sleep apnea     Surgical History: Past Surgical History:  Procedure Laterality Date   CARDIAC CATHETERIZATION  05/2006   COLONOSCOPY      Home Medications:  Allergies as of 09/23/2021   No Known Allergies      Medication List        Accurate as of September 23, 2021  1:33 PM. If you have any questions, ask your nurse or doctor.          Accu-Chek Aviva Plus test strip Generic drug: glucose blood TEST AS DIRECTED   alfuzosin 10 MG 24 hr tablet Commonly known as: UROXATRAL Take 1 tablet (10 mg total) by mouth at bedtime.   aspirin 81 MG tablet Take 81 mg by mouth daily.   BENADRYL PO Take by mouth.   co-enzyme Q-10 50 MG capsule Take 50 mg by mouth daily.   diclofenac Sodium 1 % Gel Commonly known as: VOLTAREN Apply topically 4 (four) times daily.   ezetimibe 10 MG tablet Commonly known as: Zetia Take 1 tablet (10 mg total) by mouth daily.   Ginkgo Biloba 120 MG Caps Take 1 capsule by mouth daily.   IBUPROFEN PO Take 500 mg by mouth.   metFORMIN 850 MG tablet Commonly known as: GLUCOPHAGE Take 1 tablet (850 mg total) by mouth 2 (two) times daily with a meal.   multivitamin with minerals tablet Take 1 tablet by mouth daily.    Olmesartan-amLODIPine-HCTZ 20-5-12.5 MG Tabs TAKE 1 TABLET BY MOUTH EVERY DAY   rosuvastatin 40 MG tablet Commonly known as: CRESTOR Take 1 tablet (40 mg total) by mouth daily.   tadalafil 5 MG tablet Commonly known as: CIALIS Take 1 tablet (5 mg total) by mouth daily.   tadalafil 20 MG tablet Commonly known as: Cialis Take 1 tablet (20 mg total) by mouth as needed for erectile dysfunction.   valACYclovir 1000 MG tablet Commonly known as: VALTREX TAKE 1 TABLET BY MOUTH TWICE A DAY        Allergies: No Known Allergies  Family History: Family History  Problem Relation Age of Onset   Diabetes Mother    Kidney disease Mother     Social History:  reports that he has never smoked. He has never used smokeless tobacco. He reports current alcohol use. He reports that he does not use drugs.  ROS: All other review of systems were reviewed and are negative except what is noted above in HPI  Physical Exam: BP 136/63    Pulse 82   Constitutional:  Alert and oriented, No acute distress. HEENT: [place] AT, moist mucus membranes.  Trachea midline, no masses. Cardiovascular: No clubbing, cyanosis, or edema. Respiratory: Normal respiratory effort, no increased  work of breathing. GI: Abdomen is soft, nontender, nondistended, no abdominal masses GU: No CVA tenderness.  Lymph: No cervical or inguinal lymphadenopathy. Skin: No rashes, bruises or suspicious lesions. Neurologic: Grossly intact, no focal deficits, moving all 4 extremities. Psychiatric: Normal mood and affect.  Laboratory Data: Lab Results  Component Value Date   WBC 9.3 08/11/2021   HGB 13.2 08/11/2021   HCT 38.9 08/11/2021   MCV 89 08/11/2021   PLT 270 08/11/2021    Lab Results  Component Value Date   CREATININE 1.35 (H) 08/11/2021    No results found for: PSA  Lab Results  Component Value Date   TESTOSTERONE 345 09/07/2018    Lab Results  Component Value Date   HGBA1C 7.5 (A) 08/11/2021     Urinalysis    Component Value Date/Time   APPEARANCEUR Clear 07/09/2020 0937   LABSPEC 1.030 09/08/2017 1200   GLUCOSEU 1+ (A) 07/09/2020 0937   BILIRUBINUR Negative 07/09/2020 0937   KETONESUR trace (5) (A) 09/08/2017 1200   PROTEINUR Negative 07/09/2020 0937   UROBILINOGEN negative 01/14/2016 1004   NITRITE Negative 07/09/2020 0937   LEUKOCYTESUR Negative 07/09/2020 0937    Lab Results  Component Value Date   LABMICR See below: 07/09/2020   WBCUA None seen 07/09/2020   LABEPIT None seen 07/09/2020   BACTERIA None seen 07/09/2020    Pertinent Imaging:  No results found for this or any previous visit.  No results found for this or any previous visit.  No results found for this or any previous visit.  No results found for this or any previous visit.  No results found for this or any previous visit.  No results found for this or any previous visit.  No results found for this or any previous visit.  No results found for this or any previous visit.   Assessment & Plan:    1. Erectile dysfunction, unspecified erectile dysfunction type -cialis 5mg  daily and 20mg  prn  2. Nocturia -continue uroxatral 10mg  daily  3. Benign prostatic hyperplasia with urinary obstruction -continue uroxatral 10mg  qhs     No follow-ups on file.  , MD  [facility] [place]

## 2021-09-28 DIAGNOSIS — H35033 Hypertensive retinopathy, bilateral: Secondary | ICD-10-CM | POA: Diagnosis not present

## 2021-09-28 DIAGNOSIS — E119 Type 2 diabetes mellitus without complications: Secondary | ICD-10-CM | POA: Diagnosis not present

## 2021-09-28 DIAGNOSIS — H2513 Age-related nuclear cataract, bilateral: Secondary | ICD-10-CM | POA: Diagnosis not present

## 2021-09-28 DIAGNOSIS — H40013 Open angle with borderline findings, low risk, bilateral: Secondary | ICD-10-CM | POA: Diagnosis not present

## 2021-10-07 ENCOUNTER — Ambulatory Visit: Payer: Medicare PPO | Admitting: Family Medicine

## 2021-10-22 ENCOUNTER — Telehealth: Payer: Self-pay

## 2021-10-22 NOTE — Telephone Encounter (Signed)
Lvm for pt to call back and advise if he would still like to have the flu shot or does he decline for the year. Kh ?

## 2021-12-17 ENCOUNTER — Ambulatory Visit: Payer: Medicare PPO | Admitting: Family Medicine

## 2021-12-17 ENCOUNTER — Encounter: Payer: Self-pay | Admitting: Family Medicine

## 2021-12-17 VITALS — BP 134/70 | HR 68 | Temp 97.2°F | Wt 184.0 lb

## 2021-12-17 DIAGNOSIS — E1159 Type 2 diabetes mellitus with other circulatory complications: Secondary | ICD-10-CM | POA: Diagnosis not present

## 2021-12-17 DIAGNOSIS — G4730 Sleep apnea, unspecified: Secondary | ICD-10-CM | POA: Diagnosis not present

## 2021-12-17 DIAGNOSIS — E118 Type 2 diabetes mellitus with unspecified complications: Secondary | ICD-10-CM

## 2021-12-17 DIAGNOSIS — N529 Male erectile dysfunction, unspecified: Secondary | ICD-10-CM

## 2021-12-17 DIAGNOSIS — E1121 Type 2 diabetes mellitus with diabetic nephropathy: Secondary | ICD-10-CM

## 2021-12-17 DIAGNOSIS — N3281 Overactive bladder: Secondary | ICD-10-CM | POA: Diagnosis not present

## 2021-12-17 DIAGNOSIS — H259 Unspecified age-related cataract: Secondary | ICD-10-CM

## 2021-12-17 DIAGNOSIS — E785 Hyperlipidemia, unspecified: Secondary | ICD-10-CM

## 2021-12-17 DIAGNOSIS — E1169 Type 2 diabetes mellitus with other specified complication: Secondary | ICD-10-CM

## 2021-12-17 DIAGNOSIS — I700 Atherosclerosis of aorta: Secondary | ICD-10-CM

## 2021-12-17 DIAGNOSIS — I152 Hypertension secondary to endocrine disorders: Secondary | ICD-10-CM

## 2021-12-17 LAB — POCT GLYCOSYLATED HEMOGLOBIN (HGB A1C): Hemoglobin A1C: 6.9 % — AB (ref 4.0–5.6)

## 2021-12-17 NOTE — Progress Notes (Signed)
?Subjective:  ? ? Patient ID: Jacob Swanson, male    DOB: [birth year], 71 y.o.   MRN: [id number] ? ?Jacob Swanson is a 71 y.o. male who presents for follow-up of Type 2 diabetes mellitus. ? ?Home blood sugar records:  fasting and post meal lowest 109 highest 142 ?Current symptoms/problems include none and have been stable. ?Daily foot checks:yes   Any foot concerns: none  ?Exercise: Gym/ health club routine includes mod to heavy weightlifting and walking on track . ?Diet: good ?He continues to take excellent care of himself with exercise and diet.  He continues on his blood pressure medication and is having no difficulty with that.  Does use Voltaren gel on an as-needed basis.  Continues on Glucophage.  He is also taking Crestor and Zetia and having no difficulty with that.  Does use Cialis on an as-needed basis.  Uroxatrol is helping with his bladder symptoms.  He follows up regularly with urology. ?The following portions of the patient's history were reviewed and updated as appropriate: allergies, current medications, past medical history, past social history and problem list. ? ?ROS as in subjective above. ? ?   ?Objective:  ?  ?Physical Exam ?Alert and in no distress otherwise not examined. ?Hemoglobin A1c is 6.9 ?Lab Review ? ?  Latest Ref Rng & Units 08/11/2021  ? 12:50 PM 08/11/2021  ? 11:39 AM 06/09/2021  ?  4:26 PM 12/12/2020  ? 10:17 AM 08/07/2020  ? 11:54 AM  ?Diabetic Labs  ?HbA1c 4.0 - 5.6 % 7.5    7.0   6.8     ?Microalbumin mg/L 11.1      6.5    ?Micro/Creat Ratio  4.5      2.1    ?Chol 100 - 199 mg/dL  275       ?HDL >39 mg/dL  47       ?Calc LDL 0 - 99 mg/dL  170       ?Triglycerides 0 - 149 mg/dL  82       ?Creatinine 0.76 - 1.27 mg/dL  0.17       ? ? ?  11/30/4494  ?  1:09 PM 09/14/2021  ? 10:38 AM 08/31/2021  ? 10:46 AM 08/11/2021  ?  9:59 AM 06/09/2021  ? 10:54 AM  ?BP/Weight  ?Systolic BP 136 138 98 130 140  ?Diastolic BP 63 78 60 76 82  ?Wt. (Lbs)  184.6 183 180.4 186.2  ?BMI  28.07 kg/m2 27.83 kg/m2  27.43 kg/m2 28.31 kg/m2  ? ? ?  Latest Ref Rng & Units 08/11/2021  ?  9:30 AM 03/23/2021  ? 12:00 AM  ?Foot/eye exam completion dates  ?Eye Exam No Retinopathy  Retinopathy       ?Foot Form Completion  Done   ?  ? This result is from an external source.  ? ? ?Jacob Swanson  reports that he has never smoked. He has never used smokeless tobacco. He reports current alcohol use. He reports that he does not use drugs. ? ?   ?Assessment & Plan:  ?Type 2 diabetes mellitus with complications (HCC) - Plan: POCT glycosylated hemoglobin (Hb A1C) ? ?Hyperlipidemia associated with type 2 diabetes mellitus (HCC) ? ?Hypertension associated with diabetes (HCC) ? ?OAB (overactive bladder) ? ?Erectile dysfunction, unspecified erectile dysfunction type ? ?Sleep apnea, unspecified type ? ?Aortic atherosclerosis (HCC) ? ?Age-related cataract of both eyes, unspecified age-related cataract type ? ?Diabetic nephropathy associated with type 2 diabetes mellitus (HCC) ?  ? ? ?Rx changes:  none ?Education: Reviewed ?ABCs? of diabetes management (respective goals in parentheses):  A1C (<7), blood pressure (<130/80), and cholesterol (LDL <100). ?Compliance at present is estimated to be excellent. Efforts to improve compliance (if necessary) will be directed at  no change . ?Follow up: 6 months ? ? ? ? ?  ?

## 2022-02-27 LAB — GLUCOSE, POCT (MANUAL RESULT ENTRY): POC Glucose: 172 mg/dl — AB (ref 70–99)

## 2022-03-06 ENCOUNTER — Other Ambulatory Visit: Payer: Self-pay | Admitting: Family Medicine

## 2022-03-06 DIAGNOSIS — I152 Hypertension secondary to endocrine disorders: Secondary | ICD-10-CM

## 2022-04-05 DIAGNOSIS — H04123 Dry eye syndrome of bilateral lacrimal glands: Secondary | ICD-10-CM | POA: Diagnosis not present

## 2022-04-05 DIAGNOSIS — H40013 Open angle with borderline findings, low risk, bilateral: Secondary | ICD-10-CM | POA: Diagnosis not present

## 2022-04-28 ENCOUNTER — Encounter: Payer: Self-pay | Admitting: Internal Medicine

## 2022-06-01 ENCOUNTER — Encounter: Payer: Self-pay | Admitting: Internal Medicine

## 2022-06-14 ENCOUNTER — Encounter: Payer: Self-pay | Admitting: Internal Medicine

## 2022-06-18 ENCOUNTER — Ambulatory Visit: Payer: Medicare PPO | Admitting: Family Medicine

## 2022-06-18 ENCOUNTER — Encounter: Payer: Self-pay | Admitting: Family Medicine

## 2022-06-18 VITALS — BP 124/82 | HR 64 | Temp 98.5°F | Wt 185.0 lb

## 2022-06-18 DIAGNOSIS — E118 Type 2 diabetes mellitus with unspecified complications: Secondary | ICD-10-CM

## 2022-06-18 DIAGNOSIS — G4730 Sleep apnea, unspecified: Secondary | ICD-10-CM

## 2022-06-18 DIAGNOSIS — N3281 Overactive bladder: Secondary | ICD-10-CM

## 2022-06-18 DIAGNOSIS — E785 Hyperlipidemia, unspecified: Secondary | ICD-10-CM | POA: Diagnosis not present

## 2022-06-18 DIAGNOSIS — I700 Atherosclerosis of aorta: Secondary | ICD-10-CM

## 2022-06-18 DIAGNOSIS — E1169 Type 2 diabetes mellitus with other specified complication: Secondary | ICD-10-CM

## 2022-06-18 LAB — POCT GLYCOSYLATED HEMOGLOBIN (HGB A1C): Hemoglobin A1C: 6.9 % — AB (ref 4.0–5.6)

## 2022-06-18 NOTE — Progress Notes (Signed)
  Subjective:    Patient ID: Jacob Swanson, male    DOB: 09/21/1950, 71 y.o.   MRN: [id number]  Jacob Swanson is a 71 y.o. male who presents for follow-up of Type 2 diabetes mellitus.  Home blood sugar records: fasting in the am 111 and later in day little bit higher 160-180 Current symptoms/problems include none and have been stable. Daily foot checks: yes    Any foot concerns: no issues  Exercise: Gym/ health club routine includes light weights. Diet: plant based diet doesn't eat a lot beef eating more beans  He continues on olmesartan/amlodipine and having no difficulty with that.  Continues on Uroxatrol to help with his OAB.  Does use Cialis on an as-needed basis.  He is taking metformin regularly.  He is also taking Crestor and Zetia for his lipids.  Does have evidence of aortic atherosclerosis.  Does keep himself very physically active. The following portions of the patient's history were reviewed and updated as appropriate: allergies, current medications, past medical history, past social history and problem list.  ROS as in subjective above.     Objective:    Physical Exam Alert and in no distress otherwise not examined. Hemoglobin A1c is 6.9 Lab Review    Latest Ref Rng & Units 12/17/2021   10:20 AM 08/11/2021   12:50 PM 08/11/2021   11:39 AM 06/09/2021    4:26 PM 12/12/2020   10:17 AM  Diabetic Labs  HbA1c 4.0 - 5.6 % 6.9  7.5   7.0  6.8   Microalbumin mg/L  11.1      Micro/Creat Ratio   4.5      Chol 100 - 199 mg/dL   204     HDL >39 mg/dL   47     Calc LDL 0 - 99 mg/dL   142     Triglycerides 0 - 149 mg/dL   82     Creatinine 0.76 - 1.27 mg/dL   1.35         02/27/2022    2:50 PM 12/17/2021    9:25 AM 09/23/2021    1:09 PM 09/14/2021   10:38 AM 08/31/2021   10:46 AM  BP/Weight  Systolic BP 734 193 790 240 98  Diastolic BP 81 70 63 78 60  Wt. (Lbs)  184  184.6 183  BMI  27.98 kg/m2  28.07 kg/m2 27.83 kg/m2      Latest Ref Rng & Units 08/11/2021    9:30 AM 03/23/2021    12:00 AM  Foot/eye exam completion dates  Eye Exam No Retinopathy  Retinopathy      Foot Form Completion  Done      This result is from an external source.    Stanly  reports that he has never smoked. He has never used smokeless tobacco. He reports current alcohol use. He reports that he does not use drugs.     Assessment & Plan:    Type 2 diabetes mellitus with complications (HCC) - Plan: HgB A1c  Hyperlipidemia associated with type 2 diabetes mellitus (HCC)  OAB (overactive bladder)  Sleep apnea, unspecified type  Aortic atherosclerosis ([place])  He seems to do a good job taking care of himself.  Continue on present medication regimen and I will do a complete exam visit which will be in several months

## 2022-06-29 ENCOUNTER — Telehealth: Payer: Self-pay | Admitting: Family Medicine

## 2022-06-29 NOTE — Telephone Encounter (Signed)
Left message for patient to call back and schedule Medicare Annual Wellness Visit (AWV) either virtually or in office. Left  my jabber number [phone]   Last AWV 08/11/21 please schedule with Nurse Health Adviser   45 min for awv-i and in office appointments 30 min for awv-s  phone/virtual appointments  

## 2022-07-12 ENCOUNTER — Encounter: Payer: Self-pay | Admitting: *Deleted

## 2022-07-12 NOTE — Progress Notes (Signed)
Pt's chart indicates his PCP is Dr. Sharlot Gowda, who has been f/u pt on regular intervals; last A1C checked by Dr. Susann Givens on 06/18/22 was also 6.9. No further health equity team support indicated at this time.

## 2022-07-22 ENCOUNTER — Telehealth: Payer: Self-pay | Admitting: Family Medicine

## 2022-07-22 NOTE — Telephone Encounter (Signed)
Left message for patient to call back and schedule Medicare Annual Wellness Visit (AWV) either virtually or in office. Left  my jabber number [phone]   Last AWV 08/11/21 please schedule with Nurse Health Adviser   45 min for awv-i and in office appointments 30 min for awv-s  phone/virtual appointments  

## 2022-08-20 ENCOUNTER — Ambulatory Visit (INDEPENDENT_AMBULATORY_CARE_PROVIDER_SITE_OTHER): Payer: Medicare PPO

## 2022-08-20 VITALS — Ht 69.0 in | Wt 185.0 lb

## 2022-08-20 DIAGNOSIS — Z0000 Encounter for general adult medical examination without abnormal findings: Secondary | ICD-10-CM

## 2022-08-20 NOTE — Progress Notes (Signed)
I connected with Jacob Swanson today by telephone and verified that I am speaking with the correct person using two identifiers. Location patient: home Location provider: work Persons participating in the virtual visit: Cleave Ternes, Glenna Durand LPN.   I discussed the limitations, risks, security and privacy concerns of performing an evaluation and management service by telephone and the availability of in person appointments. I also discussed with the patient that there may be a patient responsible charge related to this service. The patient expressed understanding and verbally consented to this telephonic visit.    Interactive audio and video telecommunications were attempted between this provider and patient, however failed, due to patient having technical difficulties OR patient did not have access to video capability.  We continued and completed visit with audio only.     Vital signs may be patient reported or missing.  Subjective:   Jacob Swanson is a 71 y.o. male who presents for Medicare Annual/Subsequent preventive examination.  Review of Systems     Cardiac Risk Factors include: advanced age (>50mn, >>52women);diabetes mellitus;dyslipidemia;hypertension;male gender     Objective:    Today's Vitals   08/20/22 0811  Weight: 185 lb (83.9 kg)  Height: _0  (1.753 m)   Body mass index is 27.32 kg/m.     08/20/2022    8:16 AM 08/11/2021    9:43 AM 08/07/2020    9:39 AM 09/07/2018   10:03 AM 05/11/2017    8:50 AM  Advanced Directives  Does Patient Have a Medical Advance Directive? _1   Would patient like information on creating a medical advance directive?  Yes (ED - Information included in AVS) Yes (MAU/Ambulatory/Procedural Areas - Information given) Yes (MAU/Ambulatory/Procedural Areas - Information given) Yes (MAU/Ambulatory/Procedural Areas - Information given)    Current Medications (verified) Outpatient Encounter Medications as of 08/20/2022   Medication Sig   ACCU-CHEK AVIVA PLUS test strip TEST AS DIRECTED   alfuzosin (UROXATRAL) 10 MG 24 hr tablet Take 1 tablet (10 mg total) by mouth at bedtime.   aspirin 81 MG tablet Take 81 mg by mouth daily.   co-enzyme Q-10 50 MG capsule Take 50 mg by mouth daily.   diclofenac Sodium (VOLTAREN) 1 % GEL Apply topically 4 (four) times daily.   diphenhydrAMINE HCl (BENADRYL PO) Take by mouth.   ezetimibe (ZETIA) 10 MG tablet Take 1 tablet (10 mg total) by mouth daily.   Ginkgo Biloba 120 MG CAPS Take 1 capsule by mouth daily.   IBUPROFEN PO Take 500 mg by mouth.   metFORMIN (GLUCOPHAGE) 850 MG tablet Take 1 tablet (850 mg total) by mouth 2 (two) times daily with a meal.   Multiple Vitamins-Minerals (MULTIVITAMIN WITH MINERALS) tablet Take 1 tablet by mouth daily.   Olmesartan-amLODIPine-HCTZ 20-5-12.5 MG TABS TAKE 1 TABLET BY MOUTH EVERY DAY   rosuvastatin (CRESTOR) 40 MG tablet Take 1 tablet (40 mg total) by mouth daily.   tadalafil (CIALIS) 20 MG tablet Take 1 tablet (20 mg total) by mouth as needed for erectile dysfunction.   tadalafil (CIALIS) 5 MG tablet Take 1 tablet (5 mg total) by mouth daily.   valACYclovir (VALTREX) 1000 MG tablet TAKE 1 TABLET BY MOUTH TWICE A DAY   No facility-administered encounter medications on file as of 08/20/2022.    Allergies (verified) Patient has no known allergies.   History: Past Medical History:  Diagnosis Date   Diabetes mellitus    Dyslipidemia    ED (erectile dysfunction)    Hemorrhoids  Herpes genitalis in men    Hyperlipidemia    Hypertension    Sleep apnea    Past Surgical History:  Procedure Laterality Date   CARDIAC CATHETERIZATION  05/2006   COLONOSCOPY     Family History  Problem Relation Age of Onset   Diabetes Mother    Kidney disease Mother    Social History   Socioeconomic History   Marital status: Single    Spouse name: Not on file   Number of children: Not on file   Years of education: Not on file    Highest education level: Not on file  Occupational History   Not on file  Tobacco Use   Smoking status: Never   Smokeless tobacco: Never  Vaping Use   Vaping Use: Never used  Substance and Sexual Activity   Alcohol use: Yes    Comment: socially   Drug use: No   Sexual activity: Yes    Birth control/protection: Condom  Other Topics Concern   Not on file  Social History Narrative   Not on file   Social Determinants of Health   Financial Resource Strain: Low Risk  (08/20/2022)   Overall Financial Resource Strain (CARDIA)    Difficulty of Paying Living Expenses: Not hard at all  Food Insecurity: No Food Insecurity (08/20/2022)   Hunger Vital Sign    Worried About Running Out of Food in the Last Year: Never true    [place] in the Last Year: Never true  Transportation Needs: No Transportation Needs (08/20/2022)   PRAPARE - Hydrologist (Medical): No    Lack of Transportation (Non-Medical): No  Physical Activity: Sufficiently Active (08/20/2022)   Exercise Vital Sign    Days of Exercise per Week: 5 days    Minutes of Exercise per Session: 60 min  Stress: No Stress Concern Present (08/20/2022)   [place]    Feeling of Stress : Not at all  Social Connections: Not on file    Tobacco Counseling Counseling given: Not Answered   Clinical Intake:  Pre-visit preparation completed: Yes  Pain : No/denies pain     Nutritional Status: BMI 25 -29 Overweight Nutritional Risks: None Diabetes: Yes  How often do you need to have someone help you when you read instructions, pamphlets, or other written materials from your doctor or pharmacy?: 1 - Never  Diabetic? Yes Nutrition Risk Assessment:  Has the patient had any N/V/D within the last 2 months?  No  Does the patient have any non-healing wounds?  No  Has the patient had any unintentional weight loss or weight gain?  No    Diabetes:  Is the patient diabetic?  Yes  If diabetic, was a CBG obtained today?  No  Did the patient bring in their glucometer from home?  No  How often do you monitor your CBG's? Twice daily.   Financial Strains and Diabetes Management:  Are you having any financial strains with the device, your supplies or your medication? No .  Does the patient want to be seen by Chronic Care Management for management of their diabetes?  No  Would the patient like to be referred to a Nutritionist or for Diabetic Management?  No   Diabetic Exams:  Diabetic Eye Exam: Completed 08/2021 Diabetic Foot Exam: Completed 08/11/2021   Interpreter Needed?: No  Information entered by :: NAllen LPN   Activities of Daily Living  08/20/2022    8:19 AM  In your present state of health, do you have any difficulty performing the following activities:  Hearing? 0  Comment wears hearing aids  Vision? 0  Difficulty concentrating or making decisions? 0  Walking or climbing stairs? 0  Dressing or bathing? 0  Doing errands, shopping? 0  Preparing Food and eating ? N  Using the Toilet? N  In the past six months, have you accidently leaked urine? Y  Do you have problems with loss of bowel control? N  Managing your Medications? N  Managing your Finances? N  Housekeeping or managing your Housekeeping? N    Patient Care Team: Denita Lung, MD as PCP - General (Family Medicine)  Indicate any recent Medical Services you may have received from other than [facility] providers in the past year (date may be approximate).     Assessment:   This is a routine wellness examination for Arnold.  Hearing/Vision screen Vision Screening - Comments:: Regular eye exams, Dr. Monna Fam  Dietary issues and exercise activities discussed: Current Exercise Habits: Home exercise routine, Type of exercise: strength training/weights;walking, Time (Minutes): 60, Frequency (Times/Week): 5, Weekly Exercise (Minutes/Week):  300   Goals Addressed             This Visit's Progress    Patient Stated       08/20/2022, wants to lose a few pounds       Depression Screen    08/20/2022    8:17 AM 08/11/2021    9:44 AM 08/07/2020    9:26 AM 03/10/2020   10:35 AM 10/31/2019    2:49 PM 09/07/2018    9:35 AM 05/11/2017    8:28 AM  PHQ 2/9 Scores  PHQ - 2 Score 0 0 0 0 0 0 0  PHQ- 9 Score 0    0      Fall Risk    08/20/2022    8:17 AM 08/11/2021    9:44 AM 08/07/2020    9:26 AM 03/10/2020   10:35 AM 09/07/2018    9:35 AM  Fall Risk   Falls in the past year? 0 0 0 0 0  Number falls in past yr: 0 0     Injury with Fall? 0 0     Risk for fall due to : No Fall Risks No Fall Risks No Fall Risks    Follow up Falls prevention discussed Falls evaluation completed       FALL RISK PREVENTION PERTAINING TO THE HOME:  Any stairs in or around the home? Yes  If so, are there any without handrails? No  Home free of loose throw rugs in walkways, pet beds, electrical cords, etc? Yes  Adequate lighting in your home to reduce risk of falls? Yes   ASSISTIVE DEVICES UTILIZED TO PREVENT FALLS:  Life alert? No  Use of a cane, walker or w/c? No  Grab bars in the bathroom? No  Shower chair or bench in shower? No  Elevated toilet seat or a handicapped toilet? No   TIMED UP AND GO:  Was the test performed? No .      Cognitive Function:        08/20/2022    8:20 AM  6CIT Screen  What Year? 0 points  What month? 0 points  What time? 0 points  Count back from 20 0 points  Months in reverse 0 points  Repeat phrase 0 points  Total Score 0 points    Immunizations  Immunization History  Administered Date(s) Administered   Fluad Quad(high Dose 65+) 04/13/2019   Influenza Whole 07/07/2009, 04/21/2011   Influenza, High Dose Seasonal PF 05/09/2017, 06/09/2018   Influenza, Seasonal, Injecte, Preservative Fre 08/08/2012   Influenza,inj,Quad PF,6+ Mos 08/10/2013, 04/14/2015   Influenza-Unspecified  04/23/2014, 05/12/2016   PFIZER(Purple Top)SARS-COV-2 Vaccination 09/27/2019, 10/18/2019, 06/07/2020   Pneumococcal Conjugate-13 09/10/2014   Pneumococcal Polysaccharide-23 05/19/2005   Td 04/22/2003   Tdap 08/08/2012   Zoster Recombinat (Shingrix) 05/11/2017, 09/08/2017   Zoster, Live 07/13/2011    TDAP status: Due, Education has been provided regarding the importance of this vaccine. Advised may receive this vaccine at local pharmacy or Health Dept. Aware to provide a copy of the vaccination record if obtained from local pharmacy or Health Dept. Verbalized acceptance and understanding.  Flu Vaccine status: Due, Education has been provided regarding the importance of this vaccine. Advised may receive this vaccine at local pharmacy or Health Dept. Aware to provide a copy of the vaccination record if obtained from local pharmacy or Health Dept. Verbalized acceptance and understanding.  Pneumococcal vaccine status: Up to date  Covid-19 vaccine status: Completed vaccines  Qualifies for Shingles Vaccine? Yes   Zostavax completed Yes   Shingrix Completed?: Yes  Screening Tests Health Maintenance  Topic Date Due   INFLUENZA VACCINE  03/23/2022   OPHTHALMOLOGY EXAM  03/23/2022   COVID-19 Vaccine (4 - 2023-24 season) 04/23/2022   DTaP/Tdap/Td (3 - Td or Tdap) 08/08/2022   Diabetic kidney evaluation - eGFR measurement  08/11/2022   Diabetic kidney evaluation - Urine ACR  08/11/2022   Medicare Annual Wellness (AWV)  08/11/2022   FOOT EXAM  08/11/2022   HEMOGLOBIN A1C  12/18/2022   Fecal DNA (Cologuard)  08/28/2023   Hepatitis C Screening  Completed   Zoster Vaccines- Shingrix  Completed   HPV VACCINES  Aged Out   Pneumonia Vaccine 29+ Years old  Discontinued    Health Maintenance  Health Maintenance Due  Topic Date Due   INFLUENZA VACCINE  03/23/2022   OPHTHALMOLOGY EXAM  03/23/2022   COVID-19 Vaccine (4 - 2023-24 season) 04/23/2022   DTaP/Tdap/Td (3 - Td or Tdap) 08/08/2022    Diabetic kidney evaluation - eGFR measurement  08/11/2022   Diabetic kidney evaluation - Urine ACR  08/11/2022   Medicare Annual Wellness (AWV)  08/11/2022   FOOT EXAM  08/11/2022    Colorectal cancer screening: Type of screening: Cologuard. Completed 08/27/2020. Repeat every 3 years  Lung Cancer Screening: (Low Dose CT Chest recommended if Age 4-80 years, 30 pack-year currently smoking OR have quit w/in 15years.) does not qualify.   Lung Cancer Screening Referral: no  Additional Screening:  Hepatitis C Screening: does qualify; Completed 01/14/2016  Vision Screening: Recommended annual ophthalmology exams for early detection of glaucoma and other disorders of the eye. Is the patient up to date with their annual eye exam?  Yes  Who is the provider or what is the name of the office in which the patient attends annual eye exams? Dr. Herbert Deaner If pt is not established with a provider, would they like to be referred to a provider to establish care? No .   Dental Screening: Recommended annual dental exams for proper oral hygiene  Community Resource Referral / Chronic Care Management: CRR required this visit?  No   CCM required this visit?  No      Plan:     I have personally reviewed and noted the following in the patient's chart:   Medical and  social history Use of alcohol, tobacco or illicit drugs  Current medications and supplements including opioid prescriptions. Patient is not currently taking opioid prescriptions. Functional ability and status Nutritional status Physical activity Advanced directives List of other physicians Hospitalizations, surgeries, and ER visits in previous 12 months Vitals Screenings to include cognitive, depression, and falls Referrals and appointments  In addition, I have reviewed and discussed with patient certain preventive protocols, quality metrics, and best practice recommendations. A written personalized care plan for preventive services as  well as general preventive health recommendations were provided to patient.     Kellie Simmering, LPN   [phone]   Nurse Notes: none  Due to this being a virtual visit, the after visit summary with patients personalized plan was offered to patient via mail or my-chart. Patient would like to access on my-chart

## 2022-08-20 NOTE — Patient Instructions (Signed)
Jacob Swanson , Thank you for taking time to come for your Medicare Wellness Visit. I appreciate your ongoing commitment to your health goals. Please review the following plan we discussed and let me know if I can assist you in the future.   These are the goals we discussed:  Goals      Patient Stated     08/20/2022, wants to lose a few pounds        This is a list of the screening recommended for you and due dates:  Health Maintenance  Topic Date Due   Flu Shot  03/23/2022   Eye exam for diabetics  03/23/2022   COVID-19 Vaccine (4 - 2023-24 season) 04/23/2022   DTaP/Tdap/Td vaccine (3 - Td or Tdap) 08/08/2022   Yearly kidney function blood test for diabetes  08/11/2022   Yearly kidney health urinalysis for diabetes  08/11/2022   Complete foot exam   08/11/2022   Hemoglobin A1C  12/18/2022   Medicare Annual Wellness Visit  08/21/2023   Cologuard (Stool DNA test)  08/28/2023   Hepatitis C Screening: USPSTF Recommendation to screen - Ages 15-79 yo.  Completed   Zoster (Shingles) Vaccine  Completed   HPV Vaccine  Aged Out   Pneumonia Vaccine  Discontinued    Advanced directives: Advance directive discussed with you today.   Conditions/risks identified: none  Next appointment: Follow up in one year for your annual wellness visit.   Preventive Care 83 Years and Older, Male  Preventive care refers to lifestyle choices and visits with your health care provider that can promote health and wellness. What does preventive care include? A yearly physical exam. This is also called an annual well check. Dental exams once or twice a year. Routine eye exams. Ask your health care provider how often you should have your eyes checked. Personal lifestyle choices, including: Daily care of your teeth and gums. Regular physical activity. Eating a healthy diet. Avoiding tobacco and drug use. Limiting alcohol use. Practicing safe sex. Taking low doses of aspirin every day. Taking vitamin and  mineral supplements as recommended by your health care provider. What happens during an annual well check? The services and screenings done by your health care provider during your annual well check will depend on your age, overall health, lifestyle risk factors, and family history of disease. Counseling  Your health care provider may ask you questions about your: Alcohol use. Tobacco use. Drug use. Emotional well-being. Home and relationship well-being. Sexual activity. Eating habits. History of falls. Memory and ability to understand (cognition). Work and work Astronomer. Screening  You may have the following tests or measurements: Height, weight, and BMI. Blood pressure. Lipid and cholesterol levels. These may be checked every 5 years, or more frequently if you are over 46 years old. Skin check. Lung cancer screening. You may have this screening every year starting at age 76 if you have a 30-pack-year history of smoking and currently smoke or have quit within the past 15 years. Fecal occult blood test (FOBT) of the stool. You may have this test every year starting at age 58. Flexible sigmoidoscopy or colonoscopy. You may have a sigmoidoscopy every 5 years or a colonoscopy every 10 years starting at age 15. Prostate cancer screening. Recommendations will vary depending on your family history and other risks. Hepatitis C blood test. Hepatitis B blood test. Sexually transmitted disease (STD) testing. Diabetes screening. This is done by checking your blood sugar (glucose) after you have not eaten for a  while (fasting). You may have this done every 1-3 years. Abdominal aortic aneurysm (AAA) screening. You may need this if you are a current or former smoker. Osteoporosis. You may be screened starting at age 39 if you are at high risk. Talk with your health care provider about your test results, treatment options, and if necessary, the need for more tests. Vaccines  Your health care  provider may recommend certain vaccines, such as: Influenza vaccine. This is recommended every year. Tetanus, diphtheria, and acellular pertussis (Tdap, Td) vaccine. You may need a Td booster every 10 years. Zoster vaccine. You may need this after age 60. Pneumococcal 13-valent conjugate (PCV13) vaccine. One dose is recommended after age 37. Pneumococcal polysaccharide (PPSV23) vaccine. One dose is recommended after age 44. Talk to your health care provider about which screenings and vaccines you need and how often you need them. This information is not intended to replace advice given to you by your health care provider. Make sure you discuss any questions you have with your health care provider. Document Released: 09/05/2015 Document Revised: 04/28/2016 Document Reviewed: 06/10/2015 Elsevier Interactive Patient Education  2017 [employer] Prevention in the Home Falls can cause injuries. They can happen to people of all ages. There are many things you can do to make your home safe and to help prevent falls. What can I do on the outside of my home? Regularly fix the edges of walkways and driveways and fix any cracks. Remove anything that might make you trip as you walk through a door, such as a raised step or threshold. Trim any bushes or trees on the path to your home. Use bright outdoor lighting. Clear any walking paths of anything that might make someone trip, such as rocks or tools. Regularly check to see if handrails are loose or broken. Make sure that both sides of any steps have handrails. Any raised decks and porches should have guardrails on the edges. Have any leaves, snow, or ice cleared regularly. Use sand or salt on walking paths during winter. Clean up any spills in your garage right away. This includes oil or grease spills. What can I do in the bathroom? Use night lights. Install grab bars by the toilet and in the tub and shower. Do not use towel bars as grab  bars. Use non-skid mats or decals in the tub or shower. If you need to sit down in the shower, use a plastic, non-slip stool. Keep the floor dry. Clean up any water that spills on the floor as soon as it happens. Remove soap buildup in the tub or shower regularly. Attach bath mats securely with double-sided non-slip rug tape. Do not have throw rugs and other things on the floor that can make you trip. What can I do in the bedroom? Use night lights. Make sure that you have a light by your bed that is easy to reach. Do not use any sheets or blankets that are too big for your bed. They should not hang down onto the floor. Have a firm chair that has side arms. You can use this for support while you get dressed. Do not have throw rugs and other things on the floor that can make you trip. What can I do in the kitchen? Clean up any spills right away. Avoid walking on wet floors. Keep items that you use a lot in easy-to-reach places. If you need to reach something above you, use a strong step stool that has a grab  bar. Keep electrical cords out of the way. Do not use floor polish or wax that makes floors slippery. If you must use wax, use non-skid floor wax. Do not have throw rugs and other things on the floor that can make you trip. What can I do with my stairs? Do not leave any items on the stairs. Make sure that there are handrails on both sides of the stairs and use them. Fix handrails that are broken or loose. Make sure that handrails are as long as the stairways. Check any carpeting to make sure that it is firmly attached to the stairs. Fix any carpet that is loose or worn. Avoid having throw rugs at the top or bottom of the stairs. If you do have throw rugs, attach them to the floor with carpet tape. Make sure that you have a light switch at the top of the stairs and the bottom of the stairs. If you do not have them, ask someone to add them for you. What else can I do to help prevent  falls? Wear shoes that: Do not have high heels. Have rubber bottoms. Are comfortable and fit you well. Are closed at the toe. Do not wear sandals. If you use a stepladder: Make sure that it is fully opened. Do not climb a closed stepladder. Make sure that both sides of the stepladder are locked into place. Ask someone to hold it for you, if possible. Clearly mark and make sure that you can see: Any grab bars or handrails. First and last steps. Where the edge of each step is. Use tools that help you move around (mobility aids) if they are needed. These include: Canes. Walkers. Scooters. Crutches. Turn on the lights when you go into a dark area. Replace any light bulbs as soon as they burn out. Set up your furniture so you have a clear path. Avoid moving your furniture around. If any of your floors are uneven, fix them. If there are any pets around you, be aware of where they are. Review your medicines with your doctor. Some medicines can make you feel dizzy. This can increase your chance of falling. Ask your doctor what other things that you can do to help prevent falls. This information is not intended to replace advice given to you by your health care provider. Make sure you discuss any questions you have with your health care provider. Document Released: 06/05/2009 Document Revised: 01/15/2016 Document Reviewed: 09/13/2014 Elsevier Interactive Patient Education  2017 [employer].

## 2022-08-30 ENCOUNTER — Telehealth: Payer: Self-pay | Admitting: Family Medicine

## 2022-08-30 NOTE — Telephone Encounter (Signed)
Left message for patient to call back and schedule Medicare Annual Wellness Visit (AWV) either virtually or in office. Left  my Herbie Drape number [phone]   Last AWV 08/11/21 please schedule with Nurse Health Adviser   45 min for awv-i  in office appointments 30 min for awv in office/ phone/virtual appointments

## 2022-09-08 ENCOUNTER — Other Ambulatory Visit: Payer: Self-pay | Admitting: Family Medicine

## 2022-09-08 DIAGNOSIS — I152 Hypertension secondary to endocrine disorders: Secondary | ICD-10-CM

## 2022-09-22 ENCOUNTER — Ambulatory Visit: Payer: Medicare PPO | Admitting: Urology

## 2022-09-22 ENCOUNTER — Encounter: Payer: Self-pay | Admitting: Urology

## 2022-09-22 VITALS — BP 164/82 | HR 87

## 2022-09-22 DIAGNOSIS — N401 Enlarged prostate with lower urinary tract symptoms: Secondary | ICD-10-CM

## 2022-09-22 DIAGNOSIS — N3281 Overactive bladder: Secondary | ICD-10-CM | POA: Diagnosis not present

## 2022-09-22 DIAGNOSIS — N138 Other obstructive and reflux uropathy: Secondary | ICD-10-CM | POA: Diagnosis not present

## 2022-09-22 DIAGNOSIS — R351 Nocturia: Secondary | ICD-10-CM

## 2022-09-22 DIAGNOSIS — N529 Male erectile dysfunction, unspecified: Secondary | ICD-10-CM

## 2022-09-22 LAB — URINALYSIS, ROUTINE W REFLEX MICROSCOPIC
Bilirubin, UA: NEGATIVE
Glucose, UA: NEGATIVE
Ketones, UA: NEGATIVE
Leukocytes,UA: NEGATIVE
Nitrite, UA: NEGATIVE
Protein,UA: NEGATIVE
RBC, UA: NEGATIVE
Specific Gravity, UA: 1.025 (ref 1.005–1.030)
Urobilinogen, Ur: 0.2 mg/dL (ref 0.2–1.0)
pH, UA: 5.5 (ref 5.0–7.5)

## 2022-09-22 MED ORDER — ALFUZOSIN HCL ER 10 MG PO TB24: 10.0000 mg | ORAL_TABLET | Freq: Every day | ORAL | 3 refills | Status: DC

## 2022-09-22 MED ORDER — AMBULATORY NON FORMULARY MEDICATION: 0.2000 mL | 5 refills | Status: DC | PRN

## 2022-09-22 NOTE — Progress Notes (Signed)
09/22/2022 2:25 PM   Jacob Swanson 03/22/1951 [id number]  Referring provider: Denita Lung, MD [address],  [place] [id number]  Followup BPH and erectile dysfunction   HPI: Jacob Swanson is 72yo here for followup for BPH and erectile dysfunction. IPSS 6 QOL 1 on uroxatral 10mg  daily. No recent PSA. Urine stream strong. He continue to have issues with maintaining an erection on tadalafil 5mg  daily and 20 mg.    PMH: Past Medical History:  Diagnosis Date   Diabetes mellitus    Dyslipidemia    ED (erectile dysfunction)    Hemorrhoids    Herpes genitalis in men    Hyperlipidemia    Hypertension    Sleep apnea     Surgical History: Past Surgical History:  Procedure Laterality Date   CARDIAC CATHETERIZATION  05/2006   COLONOSCOPY      Home Medications:  Allergies as of 09/22/2022   No Known Allergies      Medication List        Accurate as of September 22, 2022  2:25 PM. If you have any questions, ask your nurse or doctor.          Accu-Chek Aviva Plus test strip Generic drug: glucose blood TEST AS DIRECTED   alfuzosin 10 MG 24 hr tablet Commonly known as: UROXATRAL Take 1 tablet (10 mg total) by mouth at bedtime.   aspirin 81 MG tablet Take 81 mg by mouth daily.   BENADRYL PO Take by mouth.   co-enzyme Q-10 50 MG capsule Take 50 mg by mouth daily.   diclofenac Sodium 1 % Gel Commonly known as: VOLTAREN Apply topically 4 (four) times daily.   ezetimibe 10 MG tablet Commonly known as: Zetia Take 1 tablet (10 mg total) by mouth daily.   Ginkgo Biloba 120 MG Caps Take 1 capsule by mouth daily.   IBUPROFEN PO Take 500 mg by mouth.   metFORMIN 850 MG tablet Commonly known as: GLUCOPHAGE Take 1 tablet (850 mg total) by mouth 2 (two) times daily with a meal.   multivitamin with minerals tablet Take 1 tablet by mouth daily.   Olmesartan-amLODIPine-HCTZ 20-5-12.5 MG Tabs TAKE 1 TABLET BY MOUTH EVERY DAY   rosuvastatin 40 MG  tablet Commonly known as: CRESTOR Take 1 tablet (40 mg total) by mouth daily.   tadalafil 5 MG tablet Commonly known as: CIALIS Take 1 tablet (5 mg total) by mouth daily.   tadalafil 20 MG tablet Commonly known as: Cialis Take 1 tablet (20 mg total) by mouth as needed for erectile dysfunction.   valACYclovir 1000 MG tablet Commonly known as: VALTREX TAKE 1 TABLET BY MOUTH TWICE A DAY        Allergies: No Known Allergies  Family History: Family History  Problem Relation Age of Onset   Diabetes Mother    Kidney disease Mother     Social History:  reports that he has never smoked. He has never used smokeless tobacco. He reports current alcohol use. He reports that he does not use drugs.  ROS: All other review of systems were reviewed and are negative except what is noted above in HPI  Physical Exam: BP (!) 164/82   Pulse 87   Constitutional:  Alert and oriented, No acute distress. HEENT: [place] AT, moist mucus membranes.  Trachea midline, no masses. Cardiovascular: No clubbing, cyanosis, or edema. Respiratory: Normal respiratory effort, no increased work of breathing. GI: Abdomen is soft, nontender, nondistended, no abdominal masses GU: No CVA tenderness.  Lymph: No cervical or inguinal lymphadenopathy. Skin: No rashes, bruises or suspicious lesions. Neurologic: Grossly intact, no focal deficits, moving all 4 extremities. Psychiatric: Normal mood and affect.  Laboratory Data: Lab Results  Component Value Date   WBC 9.3 08/11/2021   HGB 13.2 08/11/2021   HCT 38.9 08/11/2021   MCV 89 08/11/2021   PLT 270 08/11/2021    Lab Results  Component Value Date   CREATININE 1.35 (H) 08/11/2021    No results found for: "PSA"  Lab Results  Component Value Date   TESTOSTERONE 345 09/07/2018    Lab Results  Component Value Date   HGBA1C 6.9 (A) 06/18/2022    Urinalysis    Component Value Date/Time   APPEARANCEUR Clear 07/09/2020 0937   LABSPEC 1.030 09/08/2017  1200   GLUCOSEU 1+ (A) 07/09/2020 0937   BILIRUBINUR Negative 07/09/2020 0937   KETONESUR trace (5) (A) 09/08/2017 1200   PROTEINUR Negative 07/09/2020 0937   UROBILINOGEN negative 01/14/2016 1004   NITRITE Negative 07/09/2020 0937   LEUKOCYTESUR Negative 07/09/2020 0937    Lab Results  Component Value Date   LABMICR See below: 07/09/2020   WBCUA None seen 07/09/2020   LABEPIT None seen 07/09/2020   BACTERIA None seen 07/09/2020    Pertinent Imaging:  No results found for this or any previous visit.  No results found for this or any previous visit.  No results found for this or any previous visit.  No results found for this or any previous visit.  No results found for this or any previous visit.  No valid procedures specified. No results found for this or any previous visit.  No results found for this or any previous visit.   Assessment & Plan:    1. Erectile dysfunction, unspecified erectile dysfunction type -we will trimix  2. Benign prostatic hyperplasia with urinary obstruction -continue uroxatral 10mg   - Urinalysis, Routine w reflex microscopic  3. Nocturia -Continue uroxatral 10mg     No follow-ups on file.  Nicolette Bang, MD  [facility]

## 2022-09-22 NOTE — Patient Instructions (Signed)
Erectile Dysfunction Erectile dysfunction (ED) is the inability to get or keep an erection in order to have sexual intercourse. ED is considered a symptom of an underlying disorder and is not considered a disease. ED may include: Inability to get an erection. Lack of enough hardness of the erection to allow penetration. Loss of erection before sex is finished. What are the causes? This condition may be caused by: Physical causes, such as: Artery problems. This may include heart disease, high blood pressure, atherosclerosis, and diabetes. Hormonal problems, such as low testosterone. Obesity. Nerve problems. This may include back or pelvic injuries, multiple sclerosis, Parkinson's disease, spinal cord injury, and stroke. Certain medicines, such as: Pain relievers. Antidepressants. Blood pressure medicines and water pills (diuretics). Cancer medicines. Antihistamines. Muscle relaxants. Lifestyle factors, such as: Use of drugs such as marijuana, cocaine, or opioids. Excessive use of alcohol. Smoking. Lack of physical activity or exercise. Psychological causes, such as: Anxiety or stress. Sadness or depression. Exhaustion. Fear about sexual performance. Guilt. What are the signs or symptoms? Symptoms of this condition include: Inability to get an erection. Lack of enough hardness of the erection to allow penetration. Loss of the erection before sex is finished. Sometimes having normal erections, but with frequent unsatisfactory episodes. Low sexual satisfaction in either partner due to erection problems. A curved penis occurring with erection. The curve may cause pain, or the penis may be too curved to allow for intercourse. Never having nighttime or morning erections. How is this diagnosed? This condition is often diagnosed by: Performing a physical exam to find other diseases or specific problems with the penis. Asking you detailed questions about the problem. Doing tests,  such as: Blood tests to check for diabetes mellitus or high cholesterol, or to measure hormone levels. Other tests to check for underlying health conditions. An ultrasound exam to check for scarring. A test to check blood flow to the penis. Doing a sleep study at home to measure nighttime erections. How is this treated? This condition may be treated by: Medicines, such as: Medicine taken by mouth to help you achieve an erection (oral medicine). Hormone replacement therapy to replace low testosterone levels. Medicine that is injected into the penis. Your health care provider may instruct you how to give yourself these injections at home. Medicine that is delivered with a short applicator tube. The tube is inserted into the opening at the tip of the penis, which is the opening of the urethra. A tiny pellet of medicine is put in the urethra. The pellet dissolves and enhances erectile function. This is also called MUSE (medicated urethral system for erections) therapy. Vacuum pump. This is a pump with a ring on it. The pump and ring are placed on the penis and used to create pressure that helps the penis become erect. Penile implant surgery. In this procedure, you may receive: An inflatable implant. This consists of cylinders, a pump, and a reservoir. The cylinders can be inflated with a fluid that helps to create an erection, and they can be deflated after intercourse. A semi-rigid implant. This consists of two silicone rubber rods. The rods provide some rigidity. They are also flexible, so the penis can both curve downward in its normal position and become straight for sexual intercourse. Blood vessel surgery to improve blood flow to the penis. During this procedure, a blood vessel from a different part of the body is placed into the penis to allow blood to flow around (bypass) damaged or blocked blood vessels. Lifestyle changes,  such as exercising more, losing weight, and quitting smoking. ?Follow  these instructions at home: ?Medicines ? ?Take over-the-counter and prescription medicines only as told by your health care provider. Do not increase the dosage without first discussing it with your health care provider. ?If you are using self-injections, do injections as directed by your health care provider. Make sure you avoid any veins that are on the surface of the penis. After giving an injection, apply pressure to the injection site for 5 minutes. ?Talk to your health care provider about how to prevent headaches while taking ED medicines. These medicines may cause a sudden headache due to the increase in blood flow in your body. ?General instructions ?Exercise regularly, as directed by your health care provider. Work with your health care provider to lose weight, if needed. ?Do not use any products that contain nicotine or tobacco. These products include cigarettes, chewing tobacco, and vaping devices, such as e-cigarettes. If you need help quitting, ask your health care provider. ?Before using a vacuum pump, read the instructions that come with the pump and discuss any questions with your health care provider. ?Keep all follow-up visits. This is important. ?Contact a health care provider if: ?You feel nauseous. ?You are vomiting. ?You get sudden headaches while taking ED medicines. ?You have any concerns about your sexual health. ?Get help right away if: ?You are taking oral or injectable medicines and you have an erection that lasts longer than 4 hours. If your health care provider is unavailable, go to the nearest emergency room for evaluation. An erection that lasts much longer than 4 hours can result in permanent damage to your penis. ?You have severe pain in your groin or abdomen. ?You develop redness or severe swelling of your penis. ?You have redness spreading at your groin or lower abdomen. ?You are unable to urinate. ?You experience chest pain or a rapid heartbeat (palpitations) after taking oral  medicines. ?These symptoms may represent a serious problem that is an emergency. Do not wait to see if the symptoms will go away. Get medical help right away. Call your local emergency services (911 in the [place] Do not drive yourself to the hospital. ?Summary ?Erectile dysfunction (ED) is the inability to get or keep an erection during sexual intercourse. ?This condition is diagnosed based on a physical exam, your symptoms, and tests to determine the cause. Treatment varies depending on the cause and may include medicines, hormone therapy, surgery, or a vacuum pump. ?You may need follow-up visits to make sure that you are using your medicines or devices correctly. ?Get help right away if you are taking or injecting medicines and you have an erection that lasts longer than 4 hours. ?This information is not intended to replace advice given to you by your health care provider. Make sure you discuss any questions you have with your health care provider. ?Document Revised: 11/05/2020 Document Reviewed: 11/05/2020 ?Elsevier Patient Education ? 2023 [employer] ? ?

## 2022-09-23 MED ORDER — AMBULATORY NON FORMULARY MEDICATION: 0.2000 mL | 5 refills | Status: DC | PRN

## 2022-09-24 LAB — PSA: Prostate Specific Ag, Serum: 2.6 ng/mL (ref 0.0–4.0)

## 2022-10-05 ENCOUNTER — Ambulatory Visit: Payer: Medicare PPO | Admitting: Urology

## 2022-10-05 VITALS — BP 143/83 | HR 83

## 2022-10-05 DIAGNOSIS — N529 Male erectile dysfunction, unspecified: Secondary | ICD-10-CM | POA: Diagnosis not present

## 2022-10-05 NOTE — Progress Notes (Unsigned)
10/05/2022 2:01 PM   Annye Asa May 23, 1951 [id number]  Referring provider: Denita Lung, [address],  [place] [address]  No chief complaint on file.   HPI:    PMH: Past Medical History:  Diagnosis Date   Diabetes mellitus    Dyslipidemia    ED (erectile dysfunction)    Hemorrhoids    Herpes genitalis in men    Hyperlipidemia    Hypertension    Sleep apnea     Surgical History: Past Surgical History:  Procedure Laterality Date   CARDIAC CATHETERIZATION  05/2006   COLONOSCOPY      Home Medications:  Allergies as of 10/05/2022   No Known Allergies      Medication List        Accurate as of October 05, 2022  2:01 PM. If you have any questions, ask your nurse or doctor.          Accu-Chek Aviva Plus test strip Generic drug: glucose blood TEST AS DIRECTED   alfuzosin 10 MG 24 hr tablet Commonly known as: UROXATRAL Take 1 tablet (10 mg total) by mouth at bedtime.   AMBULATORY NON FORMULARY MEDICATION 0.2 mLs by Intracavernosal route as needed. Medication Name: Trimix  PGE 24mg Pap 342mPhent 57m4m aspirin 81 MG tablet Take 81 mg by mouth daily.   BENADRYL PO Take by mouth.   co-enzyme Q-10 50 MG capsule Take 50 mg by mouth daily.   diclofenac Sodium 1 % Gel Commonly known as: VOLTAREN Apply topically 4 (four) times daily.   ezetimibe 10 MG tablet Commonly known as: Zetia Take 1 tablet (10 mg total) by mouth daily.   Ginkgo Biloba 120 MG Caps Take 1 capsule by mouth daily.   IBUPROFEN PO Take 500 mg by mouth.   metFORMIN 850 MG tablet Commonly known as: GLUCOPHAGE Take 1 tablet (850 mg total) by mouth 2 (two) times daily with a meal.   multivitamin with minerals tablet Take 1 tablet by mouth daily.   Olmesartan-amLODIPine-HCTZ 20-5-12.5 MG Tabs TAKE 1 TABLET BY MOUTH EVERY DAY   rosuvastatin 40 MG tablet Commonly known as: CRESTOR Take 1 tablet (40 mg total) by mouth daily.   tadalafil 5 MG  tablet Commonly known as: CIALIS Take 1 tablet (5 mg total) by mouth daily.   tadalafil 20 MG tablet Commonly known as: Cialis Take 1 tablet (20 mg total) by mouth as needed for erectile dysfunction.   valACYclovir 1000 MG tablet Commonly known as: VALTREX TAKE 1 TABLET BY MOUTH TWICE A DAY        Allergies: No Known Allergies  Family History: Family History  Problem Relation Age of Onset   Diabetes Mother    Kidney disease Mother     Social History:  reports that he has never smoked. He has never used smokeless tobacco. He reports current alcohol use. He reports that he does not use drugs.  ROS: All other review of systems were reviewed and are negative except what is noted above in HPI  Physical Exam: BP (!) 143/83   Pulse 83   Constitutional:  Alert and oriented, No acute distress. HEENT: [place] AT, moist mucus membranes.  Trachea midline, no masses. Cardiovascular: No clubbing, cyanosis, or edema. Respiratory: Normal respiratory effort, no increased work of breathing. GI: Abdomen is soft, nontender, nondistended, no abdominal masses GU: No CVA tenderness.  Lymph: No cervical or inguinal lymphadenopathy. Skin: No rashes, bruises or suspicious lesions. Neurologic: Grossly intact, no focal  deficits, moving all 4 extremities. Psychiatric: Normal mood and affect.  Laboratory Data: Lab Results  Component Value Date   WBC 9.3 08/11/2021   HGB 13.2 08/11/2021   HCT 38.9 08/11/2021   MCV 89 08/11/2021   PLT 270 08/11/2021    Lab Results  Component Value Date   CREATININE 1.35 (H) 08/11/2021    No results found for: "PSA"  Lab Results  Component Value Date   TESTOSTERONE 345 09/07/2018    Lab Results  Component Value Date   HGBA1C 6.9 (A) 06/18/2022    Urinalysis    Component Value Date/Time   APPEARANCEUR Clear 09/22/2022 1346   LABSPEC 1.030 09/08/2017 1200   GLUCOSEU Negative 09/22/2022 1346   BILIRUBINUR Negative 09/22/2022 1346   KETONESUR  trace (5) (A) 09/08/2017 1200   PROTEINUR Negative 09/22/2022 1346   UROBILINOGEN negative 01/14/2016 1004   NITRITE Negative 09/22/2022 1346   LEUKOCYTESUR Negative 09/22/2022 1346    Lab Results  Component Value Date   LABMICR Comment 09/22/2022   WBCUA None seen 07/09/2020   LABEPIT None seen 07/09/2020   BACTERIA None seen 07/09/2020    Pertinent Imaging: *** No results found for this or any previous visit.  No results found for this or any previous visit.  No results found for this or any previous visit.  No results found for this or any previous visit.  No results found for this or any previous visit.  No valid procedures specified. No results found for this or any previous visit.  No results found for this or any previous visit.  Trimix injection instruction:  Patient instructed to draw 0.9m of trimix into the insulin syringe. I them instructed him to inject at the mid penile shaft at either the 3 or 9 o'clock position. I then injected the patient and he achieved a good erection in 20 minutes. Penile injection completed.   Assessment & Plan:    1. Erectile dysfunction, unspecified erectile dysfunction type The patient was instructed on proper technique for trimix injection. He is instructed to alternate side and location of the injection. He was instructed in titrating the trimix dose. He is instructed to call the office for an erection lasting more than 4 hours    No follow-ups on file.  PNicolette Bang MD  CProv[facility] CenterUrology RBe[place]

## 2022-10-06 DIAGNOSIS — H04123 Dry eye syndrome of bilateral lacrimal glands: Secondary | ICD-10-CM | POA: Diagnosis not present

## 2022-10-06 DIAGNOSIS — E119 Type 2 diabetes mellitus without complications: Secondary | ICD-10-CM | POA: Diagnosis not present

## 2022-10-06 DIAGNOSIS — H524 Presbyopia: Secondary | ICD-10-CM | POA: Diagnosis not present

## 2022-10-06 DIAGNOSIS — H2513 Age-related nuclear cataract, bilateral: Secondary | ICD-10-CM | POA: Diagnosis not present

## 2022-10-06 DIAGNOSIS — H40013 Open angle with borderline findings, low risk, bilateral: Secondary | ICD-10-CM | POA: Diagnosis not present

## 2022-10-06 LAB — HM DIABETES EYE EXAM

## 2022-10-07 ENCOUNTER — Encounter: Payer: Self-pay | Admitting: Urology

## 2022-10-07 NOTE — Patient Instructions (Signed)
Erectile Dysfunction ?Erectile dysfunction (ED) is the inability to get or keep an erection in order to have sexual intercourse. ED is considered a symptom of an underlying disorder and is not considered a disease. ED may include: ?Inability to get an erection. ?Lack of enough hardness of the erection to allow penetration. ?Loss of erection before sex is finished. ?What are the causes? ?This condition may be caused by: ?Physical causes, such as: ?Artery problems. This may include heart disease, high blood pressure, atherosclerosis, and diabetes. ?Hormonal problems, such as low testosterone. ?Obesity. ?Nerve problems. This may include back or pelvic injuries, multiple sclerosis, Parkinson's disease, spinal cord injury, and stroke. ?Certain medicines, such as: ?Pain relievers. ?Antidepressants. ?Blood pressure medicines and water pills (diuretics). ?Cancer medicines. ?Antihistamines. ?Muscle relaxants. ?Lifestyle factors, such as: ?Use of drugs such as marijuana, cocaine, or opioids. ?Excessive use of alcohol. ?Smoking. ?Lack of physical activity or exercise. ?Psychological causes, such as: ?Anxiety or stress. ?Sadness or depression. ?Exhaustion. ?Fear about sexual performance. ?Guilt. ?What are the signs or symptoms? ?Symptoms of this condition include: ?Inability to get an erection. ?Lack of enough hardness of the erection to allow penetration. ?Loss of the erection before sex is finished. ?Sometimes having normal erections, but with frequent unsatisfactory episodes. ?Low sexual satisfaction in either partner due to erection problems. ?A curved penis occurring with erection. The curve may cause pain, or the penis may be too curved to allow for intercourse. ?Never having nighttime or morning erections. ?How is this diagnosed? ?This condition is often diagnosed by: ?Performing a physical exam to find other diseases or specific problems with the penis. ?Asking you detailed questions about the problem. ?Doing tests,  such as: ?Blood tests to check for diabetes mellitus or high cholesterol, or to measure hormone levels. ?Other tests to check for underlying health conditions. ?An ultrasound exam to check for scarring. ?A test to check blood flow to the penis. ?Doing a sleep study at home to measure nighttime erections. ?How is this treated? ?This condition may be treated by: ?Medicines, such as: ?Medicine taken by mouth to help you achieve an erection (oral medicine). ?Hormone replacement therapy to replace low testosterone levels. ?Medicine that is injected into the penis. Your health care provider may instruct you how to give yourself these injections at home. ?Medicine that is delivered with a short applicator tube. The tube is inserted into the opening at the tip of the penis, which is the opening of the urethra. A tiny pellet of medicine is put in the urethra. The pellet dissolves and enhances erectile function. This is also called MUSE (medicated urethral system for erections) therapy. ?Vacuum pump. This is a pump with a ring on it. The pump and ring are placed on the penis and used to create pressure that helps the penis become erect. ?Penile implant surgery. In this procedure, you may receive: ?An inflatable implant. This consists of cylinders, a pump, and a reservoir. The cylinders can be inflated with a fluid that helps to create an erection, and they can be deflated after intercourse. ?A semi-rigid implant. This consists of two silicone rubber rods. The rods provide some rigidity. They are also flexible, so the penis can both curve downward in its normal position and become straight for sexual intercourse. ?Blood vessel surgery to improve blood flow to the penis. During this procedure, a blood vessel from a different part of the body is placed into the penis to allow blood to flow around (bypass) damaged or blocked blood vessels. ?Lifestyle changes,   such as exercising more, losing weight, and quitting smoking. ?Follow  these instructions at home: ?Medicines ? ?Take over-the-counter and prescription medicines only as told by your health care provider. Do not increase the dosage without first discussing it with your health care provider. ?If you are using self-injections, do injections as directed by your health care provider. Make sure you avoid any veins that are on the surface of the penis. After giving an injection, apply pressure to the injection site for 5 minutes. ?Talk to your health care provider about how to prevent headaches while taking ED medicines. These medicines may cause a sudden headache due to the increase in blood flow in your body. ?General instructions ?Exercise regularly, as directed by your health care provider. Work with your health care provider to lose weight, if needed. ?Do not use any products that contain nicotine or tobacco. These products include cigarettes, chewing tobacco, and vaping devices, such as e-cigarettes. If you need help quitting, ask your health care provider. ?Before using a vacuum pump, read the instructions that come with the pump and discuss any questions with your health care provider. ?Keep all follow-up visits. This is important. ?Contact a health care provider if: ?You feel nauseous. ?You are vomiting. ?You get sudden headaches while taking ED medicines. ?You have any concerns about your sexual health. ?Get help right away if: ?You are taking oral or injectable medicines and you have an erection that lasts longer than 4 hours. If your health care provider is unavailable, go to the nearest emergency room for evaluation. An erection that lasts much longer than 4 hours can result in permanent damage to your penis. ?You have severe pain in your groin or abdomen. ?You develop redness or severe swelling of your penis. ?You have redness spreading at your groin or lower abdomen. ?You are unable to urinate. ?You experience chest pain or a rapid heartbeat (palpitations) after taking oral  medicines. ?These symptoms may represent a serious problem that is an emergency. Do not wait to see if the symptoms will go away. Get medical help right away. Call your local emergency services (911 in the [place] Do not drive yourself to the hospital. ?Summary ?Erectile dysfunction (ED) is the inability to get or keep an erection during sexual intercourse. ?This condition is diagnosed based on a physical exam, your symptoms, and tests to determine the cause. Treatment varies depending on the cause and may include medicines, hormone therapy, surgery, or a vacuum pump. ?You may need follow-up visits to make sure that you are using your medicines or devices correctly. ?Get help right away if you are taking or injecting medicines and you have an erection that lasts longer than 4 hours. ?This information is not intended to replace advice given to you by your health care provider. Make sure you discuss any questions you have with your health care provider. ?Document Revised: 11/05/2020 Document Reviewed: 11/05/2020 ?Elsevier Patient Education ? 2023 Elsevier Inc. ? ?

## 2022-10-28 ENCOUNTER — Ambulatory Visit: Payer: Medicare PPO | Admitting: Family Medicine

## 2022-10-28 ENCOUNTER — Encounter: Payer: Self-pay | Admitting: Family Medicine

## 2022-10-28 VITALS — BP 126/74 | HR 75 | Temp 98.1°F | Ht 67.5 in | Wt 187.0 lb

## 2022-10-28 DIAGNOSIS — N3281 Overactive bladder: Secondary | ICD-10-CM | POA: Diagnosis not present

## 2022-10-28 DIAGNOSIS — R972 Elevated prostate specific antigen [PSA]: Secondary | ICD-10-CM | POA: Insufficient documentation

## 2022-10-28 DIAGNOSIS — H9113 Presbycusis, bilateral: Secondary | ICD-10-CM | POA: Diagnosis not present

## 2022-10-28 DIAGNOSIS — I152 Hypertension secondary to endocrine disorders: Secondary | ICD-10-CM

## 2022-10-28 DIAGNOSIS — Z0000 Encounter for general adult medical examination without abnormal findings: Secondary | ICD-10-CM | POA: Diagnosis not present

## 2022-10-28 DIAGNOSIS — E1121 Type 2 diabetes mellitus with diabetic nephropathy: Secondary | ICD-10-CM | POA: Diagnosis not present

## 2022-10-28 DIAGNOSIS — E785 Hyperlipidemia, unspecified: Secondary | ICD-10-CM

## 2022-10-28 DIAGNOSIS — E1169 Type 2 diabetes mellitus with other specified complication: Secondary | ICD-10-CM | POA: Diagnosis not present

## 2022-10-28 DIAGNOSIS — G4730 Sleep apnea, unspecified: Secondary | ICD-10-CM

## 2022-10-28 DIAGNOSIS — E118 Type 2 diabetes mellitus with unspecified complications: Secondary | ICD-10-CM

## 2022-10-28 DIAGNOSIS — E1159 Type 2 diabetes mellitus with other circulatory complications: Secondary | ICD-10-CM | POA: Diagnosis not present

## 2022-10-28 DIAGNOSIS — I700 Atherosclerosis of aorta: Secondary | ICD-10-CM

## 2022-10-28 LAB — POCT UA - MICROALBUMIN
Albumin/Creatinine Ratio, Urine, POC: 9.5
Creatinine, POC: 392.9 mg/dL
Microalbumin Ur, POC: 37.3 mg/L

## 2022-10-28 LAB — POCT GLYCOSYLATED HEMOGLOBIN (HGB A1C): Hemoglobin A1C: 9 % — AB (ref 4.0–5.6)

## 2022-10-28 MED ORDER — EMPAGLIFLOZIN 10 MG PO TABS: 10.0000 mg | ORAL_TABLET | Freq: Every day | ORAL | 1 refills | Status: DC

## 2022-10-28 MED ORDER — OLMESARTAN-AMLODIPINE-HCTZ 20-5-12.5 MG PO TABS: 1.0000 | ORAL_TABLET | Freq: Every day | ORAL | 3 refills | Status: DC

## 2022-10-28 MED ORDER — ROSUVASTATIN CALCIUM 40 MG PO TABS: 40.0000 mg | ORAL_TABLET | Freq: Every day | ORAL | 3 refills | Status: AC

## 2022-10-28 MED ORDER — TAMSULOSIN HCL 0.4 MG PO CAPS: 0.4000 mg | ORAL_CAPSULE | Freq: Every day | ORAL | 3 refills | Status: DC

## 2022-10-28 MED ORDER — EZETIMIBE 10 MG PO TABS: 10.0000 mg | ORAL_TABLET | Freq: Every day | ORAL | 3 refills | Status: AC

## 2022-10-28 NOTE — Progress Notes (Signed)
Complete physical exam  Patient: Jacob Swanson   DOB: 09-19-1950   72 y.o. Male  MRN: [id number]  Subjective:    Chief Complaint  Patient presents with   Annual Exam    Had AWV with [facility] on 08/20/2022. Fasting.     Jacob Swanson is a 72 y.o. male who presents today for a complete physical exam. He reports consuming a general diet. Gym/ health club routine includes silver sneakers program 3-5 times a week. He generally feels well. He reports sleeping fairly well. He is followed by urology but he does state that the Uroxatrol has not been as helpful to help with his bladder related symptoms.  He did have a PSA done by urology which was in the good range.  He continues on metformin for his diabetes.  He presently does not need Cialis as he is not dating anybody.  He continues on Crestor and Zetia and having no trouble with that.  Most recent fall risk assessment:    10/28/2022   10:41 AM  Fall Risk   Falls in the past year? 0  Number falls in past yr: 0  Injury with Fall? 0  Risk for fall due to : No Fall Risks  Follow up Falls evaluation completed     Most recent depression screenings:    08/20/2022    8:17 AM 08/11/2021    9:44 AM  PHQ 2/9 Scores  PHQ - 2 Score 0 0  PHQ- 9 Score 0     Vision:Within last year and Dental: Receives regular dental care    Patient Care Team: Denita Lung, MD as PCP - General (Family Medicine)   Outpatient Medications Prior to Visit  Medication Sig   ACCU-CHEK AVIVA PLUS test strip TEST AS DIRECTED   AMBULATORY NON FORMULARY MEDICATION 0.2 mLs by Intracavernosal route as needed. Medication Name: Trimix  PGE 51mg Pap '30mg'$  Phent '1mg'$    aspirin 81 MG tablet Take 81 mg by mouth daily.   co-enzyme Q-10 50 MG capsule Take 50 mg by mouth daily.   diclofenac Sodium (VOLTAREN) 1 % GEL Apply topically 4 (four) times daily.   diphenhydrAMINE HCl (BENADRYL PO) Take by mouth.   Ginkgo Biloba 120 MG CAPS Take 1 capsule by mouth daily.   IBUPROFEN PO  Take 500 mg by mouth.   metFORMIN (GLUCOPHAGE) 850 MG tablet Take 1 tablet (850 mg total) by mouth 2 (two) times daily with a meal.   Multiple Vitamins-Minerals (MULTIVITAMIN WITH MINERALS) tablet Take 1 tablet by mouth daily.   tadalafil (CIALIS) 20 MG tablet Take 1 tablet (20 mg total) by mouth as needed for erectile dysfunction.   tadalafil (CIALIS) 5 MG tablet Take 1 tablet (5 mg total) by mouth daily.   valACYclovir (VALTREX) 1000 MG tablet TAKE 1 TABLET BY MOUTH TWICE A DAY   [DISCONTINUED] alfuzosin (UROXATRAL) 10 MG 24 hr tablet Take 1 tablet (10 mg total) by mouth at bedtime.   [DISCONTINUED] ezetimibe (ZETIA) 10 MG tablet Take 1 tablet (10 mg total) by mouth daily.   [DISCONTINUED] Olmesartan-amLODIPine-HCTZ 20-5-12.5 MG TABS TAKE 1 TABLET BY MOUTH EVERY DAY   [DISCONTINUED] rosuvastatin (CRESTOR) 40 MG tablet Take 1 tablet (40 mg total) by mouth daily.   No facility-administered medications prior to visit.    Review of Systems  All other systems reviewed and are negative.         Objective:     BP 126/74   Pulse 75   Temp 98.1 F (  36.7 C) (Oral)   Ht 5' 7.5" (1.715 m)   Wt 187 lb (84.8 kg)   SpO2 97% Comment: room air  BMI 28.86 kg/m    Physical Exam   Alert and in no distress. Tympanic membranes and canals are normal. Pharyngeal area is normal. Neck is supple without adenopathy or thyromegaly. Cardiac exam shows a regular sinus rhythm without murmurs or gallops. Lungs are clear to auscultation. Hemoglobin A1c is 9.0     Assessment & Plan:    Routine general medical examination at a health care facility  Hypertension associated with diabetes ([place]) - Plan: CBC with Differential/Platelet, Comprehensive metabolic panel, Olmesartan-amLODIPine-HCTZ 20-5-12.5 MG TABS  Aortic atherosclerosis ([place]) - Plan: Lipid panel  Type 2 diabetes mellitus with complications ([place]) - Plan: CBC with Differential/Platelet, Comprehensive metabolic panel, Lipid panel, POCT  glycosylated hemoglobin (Hb A1C), POCT UA - Microalbumin, empagliflozin (JARDIANCE) 10 MG TABS tablet  Hyperlipidemia associated with type 2 diabetes mellitus ([place]) - Plan: Lipid panel, rosuvastatin (CRESTOR) 40 MG tablet, ezetimibe (ZETIA) 10 MG tablet  Diabetic nephropathy associated with type 2 diabetes mellitus (HCC)  OAB (overactive bladder) - Plan: tamsulosin (FLOMAX) 0.4 MG CAPS capsule  Elevated PSA  Presbycusis of both ears  Immunization History  Administered Date(s) Administered   Fluad Quad(high Dose 65+) 04/13/2019   Influenza Whole 07/07/2009, 04/21/2011   Influenza, High Dose Seasonal PF 05/09/2017, 06/09/2018   Influenza, Seasonal, Injecte, Preservative Fre 08/08/2012   Influenza,inj,Quad PF,6+ Mos 08/10/2013, 04/14/2015   Influenza-Unspecified 04/23/2014, 05/12/2016   PFIZER(Purple Top)SARS-COV-2 Vaccination 09/27/2019, 10/18/2019, 06/07/2020   Pneumococcal Conjugate-13 09/10/2014   Pneumococcal Polysaccharide-23 05/19/2005   Td 04/22/2003   Tdap 08/08/2012   Zoster Recombinat (Shingrix) 05/11/2017, 09/08/2017   Zoster, Live 07/13/2011    Health Maintenance  Topic Date Due   INFLUENZA VACCINE  03/23/2022   COVID-19 Vaccine (4 - 2023-24 season) 04/23/2022   DTaP/Tdap/Td (3 - Td or Tdap) 08/08/2022   Diabetic kidney evaluation - eGFR measurement  08/11/2022   Diabetic kidney evaluation - Urine ACR  08/11/2022   FOOT EXAM  08/11/2022   HEMOGLOBIN A1C  12/18/2022   Medicare Annual Wellness (AWV)  08/21/2023   Fecal DNA (Cologuard)  08/28/2023   OPHTHALMOLOGY EXAM  10/07/2023   Hepatitis C Screening  Completed   Zoster Vaccines- Shingrix  Completed   HPV VACCINES  Aged Out   Pneumonia Vaccine 44+ Years old  Discontinued    Discussed his hemoglobin A1c and the fact that this probably represents just worsening of the disease.  I think he is probably taking excellent care of himself.  I will add Jardiance to his regimen.  Discussed possible side effects of  medication.  I will also switch him to Flomax to see if that will help with his urinary symptoms.  Recommend he get RSV and Tdap at the drugstore.  Recheck here in about 4 months. Problem List Items Addressed This Visit     Aortic atherosclerosis ([place])   Relevant Medications   rosuvastatin (CRESTOR) 40 MG tablet   Olmesartan-amLODIPine-HCTZ 20-5-12.5 MG TABS   ezetimibe (ZETIA) 10 MG tablet   Other Relevant Orders   Lipid panel   Diabetic nephropathy associated with type 2 diabetes mellitus (HCC)   Relevant Medications   rosuvastatin (CRESTOR) 40 MG tablet   Olmesartan-amLODIPine-HCTZ 20-5-12.5 MG TABS   empagliflozin (JARDIANCE) 10 MG TABS tablet   Elevated PSA   Hyperlipidemia associated with type 2 diabetes mellitus (HCC)   Relevant Medications   rosuvastatin (CRESTOR) 40 MG tablet  Olmesartan-amLODIPine-HCTZ 20-5-12.5 MG TABS   ezetimibe (ZETIA) 10 MG tablet   empagliflozin (JARDIANCE) 10 MG TABS tablet   Other Relevant Orders   Lipid panel   Hypertension associated with diabetes ([place])   Relevant Medications   rosuvastatin (CRESTOR) 40 MG tablet   Olmesartan-amLODIPine-HCTZ 20-5-12.5 MG TABS   ezetimibe (ZETIA) 10 MG tablet   empagliflozin (JARDIANCE) 10 MG TABS tablet   Other Relevant Orders   CBC with Differential/Platelet   Comprehensive metabolic panel   OAB (overactive bladder)   Relevant Medications   tamsulosin (FLOMAX) 0.4 MG CAPS capsule   Presbycusis of both ears   Type 2 diabetes mellitus with complications (HCC)   Relevant Medications   rosuvastatin (CRESTOR) 40 MG tablet   Olmesartan-amLODIPine-HCTZ 20-5-12.5 MG TABS   empagliflozin (JARDIANCE) 10 MG TABS tablet   Other Relevant Orders   CBC with Differential/Platelet   Comprehensive metabolic panel   Lipid panel   POCT glycosylated hemoglobin (Hb A1C)   POCT UA - Microalbumin   Other Visit Diagnoses     Routine general medical examination at a health care facility    -  Primary      Return in  about 4 months (around 02/27/2023).     Jill Alexanders, MD

## 2022-10-29 LAB — LIPID PANEL
Chol/HDL Ratio: 4.2 ratio (ref 0.0–5.0)
Cholesterol, Total: 216 mg/dL — ABNORMAL HIGH (ref 100–199)
HDL: 52 mg/dL (ref >39)
LDL Chol Calc (NIH): 153 mg/dL — ABNORMAL HIGH (ref 0–99)
Triglycerides: 62 mg/dL (ref 0–149)
VLDL Cholesterol Cal: 11 mg/dL (ref 5–40)

## 2022-10-29 LAB — CBC WITH DIFFERENTIAL/PLATELET
Basophils Absolute: 0 10*3/uL (ref 0.0–0.2)
Basos: 1 %
EOS (ABSOLUTE): 0.1 10*3/uL (ref 0.0–0.4)
Eos: 2 %
Hematocrit: 38.3 % (ref 37.5–51.0)
Hemoglobin: 13 g/dL (ref 13.0–17.7)
Immature Grans (Abs): 0 10*3/uL (ref 0.0–0.1)
Immature Granulocytes: 0 %
Lymphocytes Absolute: 2.4 10*3/uL (ref 0.7–3.1)
Lymphs: 45 %
MCH: 31.6 pg (ref 26.6–33.0)
MCHC: 33.9 g/dL (ref 31.5–35.7)
MCV: 93 fL (ref 79–97)
Monocytes Absolute: 0.3 10*3/uL (ref 0.1–0.9)
Monocytes: 6 %
Neutrophils Absolute: 2.5 10*3/uL (ref 1.4–7.0)
Neutrophils: 46 %
Platelets: 215 10*3/uL (ref 150–450)
RBC: 4.11 x10E6/uL — ABNORMAL LOW (ref 4.14–5.80)
RDW: 12.8 % (ref 11.6–15.4)
WBC: 5.3 10*3/uL (ref 3.4–10.8)

## 2022-10-29 LAB — COMPREHENSIVE METABOLIC PANEL
ALT: 18 IU/L (ref 0–44)
AST: 21 IU/L (ref 0–40)
Albumin/Globulin Ratio: 1.5 (ref 1.2–2.2)
Albumin: 4.3 g/dL (ref 3.8–4.8)
Alkaline Phosphatase: 87 IU/L (ref 44–121)
BUN/Creatinine Ratio: 16 (ref 10–24)
BUN: 21 mg/dL (ref 8–27)
Bilirubin Total: 0.5 mg/dL (ref 0.0–1.2)
CO2: 22 mmol/L (ref 20–29)
Calcium: 9.7 mg/dL (ref 8.6–10.2)
Chloride: 104 mmol/L (ref 96–106)
Creatinine, Ser: 1.28 mg/dL — ABNORMAL HIGH (ref 0.76–1.27)
Globulin, Total: 2.8 g/dL (ref 1.5–4.5)
Glucose: 143 mg/dL — ABNORMAL HIGH (ref 70–99)
Potassium: 4.8 mmol/L (ref 3.5–5.2)
Sodium: 141 mmol/L (ref 134–144)
Total Protein: 7.1 g/dL (ref 6.0–8.5)
eGFR: 59 mL/min/{1.73_m2} — ABNORMAL LOW (ref >59)

## 2023-02-28 ENCOUNTER — Ambulatory Visit: Payer: Medicare PPO | Admitting: Medical

## 2023-03-02 ENCOUNTER — Ambulatory Visit: Payer: Medicare PPO | Admitting: Family Medicine

## 2023-03-02 ENCOUNTER — Encounter: Payer: Self-pay | Admitting: Family Medicine

## 2023-03-02 VITALS — BP 136/80 | HR 76 | Temp 98.2°F | Resp 16 | Wt 181.4 lb

## 2023-03-02 DIAGNOSIS — E1159 Type 2 diabetes mellitus with other circulatory complications: Secondary | ICD-10-CM | POA: Diagnosis not present

## 2023-03-02 DIAGNOSIS — N3281 Overactive bladder: Secondary | ICD-10-CM

## 2023-03-02 DIAGNOSIS — R972 Elevated prostate specific antigen [PSA]: Secondary | ICD-10-CM

## 2023-03-02 DIAGNOSIS — M1990 Unspecified osteoarthritis, unspecified site: Secondary | ICD-10-CM

## 2023-03-02 DIAGNOSIS — H409 Unspecified glaucoma: Secondary | ICD-10-CM

## 2023-03-02 DIAGNOSIS — H259 Unspecified age-related cataract: Secondary | ICD-10-CM

## 2023-03-02 DIAGNOSIS — I152 Hypertension secondary to endocrine disorders: Secondary | ICD-10-CM

## 2023-03-02 DIAGNOSIS — I700 Atherosclerosis of aorta: Secondary | ICD-10-CM | POA: Diagnosis not present

## 2023-03-02 DIAGNOSIS — E1169 Type 2 diabetes mellitus with other specified complication: Secondary | ICD-10-CM

## 2023-03-02 DIAGNOSIS — H9113 Presbycusis, bilateral: Secondary | ICD-10-CM

## 2023-03-02 DIAGNOSIS — E785 Hyperlipidemia, unspecified: Secondary | ICD-10-CM

## 2023-03-02 DIAGNOSIS — E118 Type 2 diabetes mellitus with unspecified complications: Secondary | ICD-10-CM | POA: Diagnosis not present

## 2023-03-02 DIAGNOSIS — E1121 Type 2 diabetes mellitus with diabetic nephropathy: Secondary | ICD-10-CM | POA: Diagnosis not present

## 2023-03-02 LAB — POCT GLYCOSYLATED HEMOGLOBIN (HGB A1C): Hemoglobin A1C: 7.2 % — AB (ref 4.0–5.6)

## 2023-03-02 NOTE — Progress Notes (Signed)
Subjective:    Patient ID: Jacob Swanson, male    DOB: October 16, 1950, 72 y.o.   MRN: [id number]  Jacob Swanson is a 72 y.o. male who presents for follow-up of Type 2 diabetes mellitus.  Home blood sugar records: fasting range: 110-130 Current symptoms/problems include polyuria and have been stable. Daily foot checks: yes   Any foot concerns: no How often blood sugars checked: daily Exercise: Gym/ health club routine includes stationary bike and walking on track .  On his last visit he was started on Jardiance and has had no difficulty with that.  He continues on metformin as well.  He is stable on his blood pressure medications.  He does have some arthritic type symptoms of his knees and has been using Voltaren on that.  Continues on Crestor and Zetia without difficulty.  Flomax is helping with his bladder related symptoms.  He is followed by Dr. Ronne Binning for his PSA.  He does wear hearing aids.  He sees his eye doctor for his glaucoma and cataracts.  [facility]. Diet:Regular diet   The following portions of the patient's history were reviewed and updated as appropriate: allergies, current medications, past medical history, past social history and problem list.  ROS as in subjective above.     Objective:    Physical Exam Alert and in no distress otherwise not examined. Hemoglobin A1c is 7.2. Blood pressure 136/80, pulse 76, temperature 98.2 F (36.8 C), temperature source Oral, resp. rate 16, weight 181 lb 6.4 oz (82.3 kg), SpO2 98 %.  Lab Review    Latest Ref Rng & Units 03/02/2023    1:50 PM 10/28/2022   12:00 PM 10/28/2022   11:33 AM 10/28/2022   11:20 AM 06/18/2022   11:53 AM  Diabetic Labs  HbA1c 4.0 - 5.6 % 7.2   9.0   6.9   Microalbumin mg/L  37.3      Micro/Creat Ratio   9.5      Chol 100 - 199 mg/dL    409    HDL >81 mg/dL    52    Calc LDL 0 - 99 mg/dL    191    Triglycerides 0 - 149 mg/dL    62    Creatinine 4.78 - 1.27 mg/dL     2.95        02/10/3085    1:32 PM 10/28/2022   10:40 AM 10/05/2022    1:32 PM 09/22/2022    1:56 PM 08/20/2022    8:11 AM  BP/Weight  Systolic BP 136 126 143 164   Diastolic BP 80 74 83 82   Wt. (Lbs) 181.4 187   185  BMI 27.99 kg/m2 28.86 kg/m2   27.32 kg/m2      Latest Ref Rng & Units 10/06/2022   12:00 AM 08/11/2021    9:30 AM  Foot/eye exam completion dates  Eye Exam No Retinopathy Retinopathy       Foot Form Completion   Done     This result is from an external source.    Jacob Swanson  reports that he has never smoked. He has never used smokeless tobacco. He reports current alcohol use. He reports that he does not use drugs.     Assessment & Plan:    Type 2 diabetes mellitus with complications (HCC) - Plan: POCT glycosylated hemoglobin (Hb A1C)  Hypertension associated with diabetes (HCC)  Hyperlipidemia associated with type 2 diabetes mellitus (HCC)  Diabetic nephropathy associated with type 2 diabetes mellitus (HCC)  Aortic atherosclerosis (HCC)  Presbycusis of both ears  Glaucoma, unspecified glaucoma type, unspecified laterality  OAB (overactive bladder)  Age-related cataract of both eyes, unspecified age-related cataract type  Elevated PSA  Arthritis  I expressed satisfaction with the fact that his [place] seems to be helping with his diabetes.  He will continue on his other medications and I will check him again in about 4 months.  Did recommend that he use Tylenol first for his arthritic pain and use Voltaren on an as-needed basis.

## 2023-03-04 ENCOUNTER — Telehealth: Payer: Self-pay | Admitting: Family Medicine

## 2023-03-04 DIAGNOSIS — E118 Type 2 diabetes mellitus with unspecified complications: Secondary | ICD-10-CM

## 2023-03-04 MED ORDER — ACCU-CHEK AVIVA PLUS VI STRP: 1.0000 | ORAL_STRIP | 1 refills | Status: DC

## 2023-03-04 NOTE — Telephone Encounter (Signed)
Sent to pharmacy 

## 2023-03-04 NOTE — Telephone Encounter (Signed)
Pt requesting a refill on ACCU-CHEK AVIVA PLUS test strip sent to  [id number] - [place], [place] - [address]

## 2023-03-08 ENCOUNTER — Other Ambulatory Visit: Payer: Self-pay

## 2023-03-08 DIAGNOSIS — E118 Type 2 diabetes mellitus with unspecified complications: Secondary | ICD-10-CM

## 2023-03-08 MED ORDER — ACCU-CHEK AVIVA PLUS VI STRP: 1.0000 | ORAL_STRIP | Freq: Every day | 1 refills | Status: DC

## 2023-04-07 DIAGNOSIS — H40013 Open angle with borderline findings, low risk, bilateral: Secondary | ICD-10-CM | POA: Diagnosis not present

## 2023-04-07 DIAGNOSIS — H04123 Dry eye syndrome of bilateral lacrimal glands: Secondary | ICD-10-CM | POA: Diagnosis not present

## 2023-04-07 LAB — HM DIABETES EYE EXAM

## 2023-04-13 ENCOUNTER — Ambulatory Visit: Payer: Medicare PPO | Admitting: Urology

## 2023-04-13 VITALS — BP 124/67 | HR 72

## 2023-04-13 DIAGNOSIS — N401 Enlarged prostate with lower urinary tract symptoms: Secondary | ICD-10-CM

## 2023-04-13 DIAGNOSIS — N138 Other obstructive and reflux uropathy: Secondary | ICD-10-CM | POA: Diagnosis not present

## 2023-04-13 DIAGNOSIS — N3281 Overactive bladder: Secondary | ICD-10-CM

## 2023-04-13 DIAGNOSIS — R351 Nocturia: Secondary | ICD-10-CM | POA: Diagnosis not present

## 2023-04-13 DIAGNOSIS — N529 Male erectile dysfunction, unspecified: Secondary | ICD-10-CM | POA: Diagnosis not present

## 2023-04-13 MED ORDER — TAMSULOSIN HCL 0.4 MG PO CAPS: 0.4000 mg | ORAL_CAPSULE | Freq: Every day | ORAL | 3 refills | Status: DC

## 2023-04-13 NOTE — Progress Notes (Signed)
04/13/2023 2:01 PM   Jacob Swanson 1951-05-05 [id number]  Referring provider: Ronnald Nian, MD [address] [place],  [place] [address]  Followup BPh and erectile dysfunction   HPI: [name] is a 72yo here for followup for erectile dysfunction and BPH. He uses trimix prn with good results. IPSS 6 QOL 2 on flomax 0.4mg  daily. Urine stream strong. Nocturia 2-3x.    PMH: Past Medical History:  Diagnosis Date   Diabetes mellitus    Dyslipidemia    ED (erectile dysfunction)    Hemorrhoids    Herpes genitalis in men    Hyperlipidemia    Hypertension    Sleep apnea     Surgical History: Past Surgical History:  Procedure Laterality Date   CARDIAC CATHETERIZATION  05/2006   COLONOSCOPY      Home Medications:  Allergies as of 04/13/2023   No Known Allergies      Medication List        Accurate as of April 13, 2023  2:01 PM. If you have any questions, ask your nurse or doctor.          Accu-Chek Aviva Plus test strip Generic drug: glucose blood 1 each by Other route daily. for testing   AMBULATORY NON FORMULARY MEDICATION 0.2 mLs by Intracavernosal route as needed. Medication Name: Trimix  PGE Pap 30mg  Phent 1mg    aspirin 81 MG tablet Take 81 mg by mouth daily.   BENADRYL PO Take by mouth.   co-enzyme Q-10 50 MG capsule Take 50 mg by mouth daily.   diclofenac Sodium 1 % Gel Commonly known as: VOLTAREN Apply topically 4 (four) times daily.   empagliflozin 10 MG Tabs tablet Commonly known as: Jardiance Take 1 tablet (10 mg total) by mouth daily before breakfast.   ezetimibe 10 MG tablet Commonly known as: Zetia Take 1 tablet (10 mg total) by mouth daily.   Ginkgo Biloba 120 MG Caps Take 1 capsule by mouth daily.   IBUPROFEN PO Take 500 mg by mouth.   metFORMIN 850 MG tablet Commonly known as: GLUCOPHAGE Take 1 tablet (850 mg total) by mouth 2 (two) times daily with a meal.   multivitamin with minerals tablet Take 1  tablet by mouth daily.   Olmesartan-amLODIPine-HCTZ 20-5-12.5 MG Tabs Take 1 tablet by mouth daily.   rosuvastatin 40 MG tablet Commonly known as: CRESTOR Take 1 tablet (40 mg total) by mouth daily.   tadalafil 5 MG tablet Commonly known as: CIALIS Take 1 tablet (5 mg total) by mouth daily.   tadalafil 20 MG tablet Commonly known as: Cialis Take 1 tablet (20 mg total) by mouth as needed for erectile dysfunction.   tamsulosin 0.4 MG Caps capsule Commonly known as: FLOMAX Take 1 capsule (0.4 mg total) by mouth daily.   valACYclovir 1000 MG tablet Commonly known as: VALTREX TAKE 1 TABLET BY MOUTH TWICE A DAY        Allergies: No Known Allergies  Family History: Family History  Problem Relation Age of Onset   Diabetes Mother    Kidney disease Mother     Social History:  reports that he has never smoked. He has never used smokeless tobacco. He reports current alcohol use. He reports that he does not use drugs.  ROS: All other review of systems were reviewed and are negative except what is noted above in HPI  Physical Exam: BP 124/67   Pulse 72   Constitutional:  Alert and oriented, No acute distress. HEENT: [place]  AT, moist mucus membranes.  Trachea midline, no masses. Cardiovascular: No clubbing, cyanosis, or edema. Respiratory: Normal respiratory effort, no increased work of breathing. GI: Abdomen is soft, nontender, nondistended, no abdominal masses GU: No CVA tenderness.  Lymph: No cervical or inguinal lymphadenopathy. Skin: No rashes, bruises or suspicious lesions. Neurologic: Grossly intact, no focal deficits, moving all 4 extremities. Psychiatric: Normal mood and affect.  Laboratory Data: Lab Results  Component Value Date   WBC 5.3 10/28/2022   HGB 13.0 10/28/2022   HCT 38.3 10/28/2022   MCV 93 10/28/2022   PLT 215 10/28/2022    Lab Results  Component Value Date   CREATININE 1.28 (H) 10/28/2022    No results found for: "PSA"  Lab Results   Component Value Date   TESTOSTERONE 345 09/07/2018    Lab Results  Component Value Date   HGBA1C 7.2 (A) 03/02/2023    Urinalysis    Component Value Date/Time   APPEARANCEUR Clear 09/22/2022 1346   LABSPEC 1.030 09/08/2017 1200   GLUCOSEU Negative 09/22/2022 1346   BILIRUBINUR Negative 09/22/2022 1346   KETONESUR trace (5) (A) 09/08/2017 1200   PROTEINUR Negative 09/22/2022 1346   UROBILINOGEN negative 01/14/2016 1004   NITRITE Negative 09/22/2022 1346   LEUKOCYTESUR Negative 09/22/2022 1346    Lab Results  Component Value Date   LABMICR Comment 09/22/2022   WBCUA None seen 07/09/2020   LABEPIT None seen 07/09/2020   BACTERIA None seen 07/09/2020    Pertinent Imaging:  No results found for this or any previous visit.  No results found for this or any previous visit.  No results found for this or any previous visit.  No results found for this or any previous visit.  No results found for this or any previous visit.  No valid procedures specified. No results found for this or any previous visit.  No results found for this or any previous visit.   Assessment & Plan:    1. Benign prostatic hyperplasia with urinary obstruction -continue flomax 0.4mg  daily - Urinalysis, Routine w reflex microscopic  2. Erectile dysfunction, unspecified erectile dysfunction type -continue trimix prn  3. Nocturia -continue flomax 0.4mg  daily   No follow-ups on file.  Wilkie Aye, MD  [facility] [place]

## 2023-04-14 LAB — URINALYSIS, ROUTINE W REFLEX MICROSCOPIC
Bilirubin, UA: NEGATIVE
Glucose, UA: NEGATIVE
Ketones, UA: NEGATIVE
Leukocytes,UA: NEGATIVE
Nitrite, UA: NEGATIVE
Protein,UA: NEGATIVE
RBC, UA: NEGATIVE
Specific Gravity, UA: 1.015 (ref 1.005–1.030)
Urobilinogen, Ur: 0.2 mg/dL (ref 0.2–1.0)
pH, UA: 6 (ref 5.0–7.5)

## 2023-04-19 ENCOUNTER — Encounter: Payer: Self-pay | Admitting: Urology

## 2023-04-19 NOTE — Patient Instructions (Signed)

## 2023-07-13 ENCOUNTER — Encounter: Payer: Self-pay | Admitting: Family Medicine

## 2023-07-13 ENCOUNTER — Ambulatory Visit: Payer: Medicare PPO | Admitting: Family Medicine

## 2023-07-13 VITALS — BP 122/76 | HR 68 | Wt 183.4 lb

## 2023-07-13 DIAGNOSIS — N5201 Erectile dysfunction due to arterial insufficiency: Secondary | ICD-10-CM | POA: Diagnosis not present

## 2023-07-13 DIAGNOSIS — E785 Hyperlipidemia, unspecified: Secondary | ICD-10-CM

## 2023-07-13 DIAGNOSIS — E1169 Type 2 diabetes mellitus with other specified complication: Secondary | ICD-10-CM

## 2023-07-13 DIAGNOSIS — H9113 Presbycusis, bilateral: Secondary | ICD-10-CM

## 2023-07-13 DIAGNOSIS — E118 Type 2 diabetes mellitus with unspecified complications: Secondary | ICD-10-CM | POA: Diagnosis not present

## 2023-07-13 DIAGNOSIS — E1159 Type 2 diabetes mellitus with other circulatory complications: Secondary | ICD-10-CM

## 2023-07-13 DIAGNOSIS — H25013 Cortical age-related cataract, bilateral: Secondary | ICD-10-CM

## 2023-07-13 DIAGNOSIS — H4010X2 Unspecified open-angle glaucoma, moderate stage: Secondary | ICD-10-CM | POA: Diagnosis not present

## 2023-07-13 DIAGNOSIS — E1121 Type 2 diabetes mellitus with diabetic nephropathy: Secondary | ICD-10-CM | POA: Diagnosis not present

## 2023-07-13 DIAGNOSIS — M1990 Unspecified osteoarthritis, unspecified site: Secondary | ICD-10-CM | POA: Diagnosis not present

## 2023-07-13 DIAGNOSIS — G4730 Sleep apnea, unspecified: Secondary | ICD-10-CM

## 2023-07-13 DIAGNOSIS — N3281 Overactive bladder: Secondary | ICD-10-CM

## 2023-07-13 DIAGNOSIS — I152 Hypertension secondary to endocrine disorders: Secondary | ICD-10-CM

## 2023-07-13 LAB — POCT GLYCOSYLATED HEMOGLOBIN (HGB A1C): Hemoglobin A1C: 7.6 % — AB (ref 4.0–5.6)

## 2023-07-13 MED ORDER — EMPAGLIFLOZIN 25 MG PO TABS: 25.0000 mg | ORAL_TABLET | Freq: Every day | ORAL | 1 refills | Status: DC

## 2023-07-13 NOTE — Progress Notes (Signed)
  Subjective:    Patient ID: Jacob Swanson, male    DOB: Feb 28, 1951, 72 y.o.   MRN: [id number]  Jacob Swanson is a 72 y.o. male who presents for follow-up of Type 2 diabetes mellitus.  He continues on [place] and is having no difficulty with that.  He does do a good job with diet and exercise.  Continues on olmesartan/HCTZ and is also taking Crestor without difficulty as well as Zetia.  Continues on Flomax for his urologic problems.  Has had a recent eye exam and does follow-up regularly with him.  Continues on his CPAP without difficulty.   The following portions of the patient's history were reviewed and updated as appropriate: allergies, current medications, past medical history, past social history and problem list.  ROS as in subjective above.     Objective:    Physical Exam Alert and in no distress otherwise not examined. Hemoglobin A1c is 7.6 Blood pressure 122/76, pulse 68, weight 183 lb 6.4 oz (83.2 kg).  Lab Review    Latest Ref Rng & Units 07/13/2023   11:49 AM 03/02/2023    1:50 PM 10/28/2022   12:00 PM 10/28/2022   11:33 AM 10/28/2022   11:20 AM  Diabetic Labs  HbA1c 4.0 - 5.6 % 7.6  7.2   9.0    Microalbumin mg/L   37.3     Micro/Creat Ratio    9.5     Chol 100 - 199 mg/dL     387   HDL >56 mg/dL     52   Calc LDL 0 - 99 mg/dL     433   Triglycerides 0 - 149 mg/dL     62   Creatinine 2.95 - 1.27 mg/dL     1.88       41/66/0630   11:35 AM 04/13/2023    1:29 PM 03/02/2023    1:32 PM 10/28/2022   10:40 AM 10/05/2022    1:32 PM  BP/Weight  Systolic BP 122 124 136 126 143  Diastolic BP 76 67 80 74 83  Wt. (Lbs) 183.4  181.4 187   BMI 28.3 kg/m2  27.99 kg/m2 28.86 kg/m2       Latest Ref Rng & Units 04/07/2023   12:00 AM 10/06/2022   12:00 AM  Foot/eye exam completion dates  Eye Exam No Retinopathy Retinopathy     Retinopathy         This result is from an external source.    Jacob Swanson  reports that he has never smoked. He has never used smokeless tobacco. He reports  current alcohol use. He reports that he does not use drugs.     Assessment & Plan:    Type 2 diabetes mellitus with complications (HCC) - Plan: empagliflozin (JARDIANCE) 25 MG TABS tablet  Cortical age-related cataract of both eyes  Arthritis  Diabetic nephropathy associated with type 2 diabetes mellitus (HCC)  Erectile dysfunction due to arterial insufficiency  Open-angle glaucoma of both eyes, moderate stage, unspecified open-angle glaucoma type  Hyperlipidemia associated with type 2 diabetes mellitus (HCC) - Plan: POCT glycosylated hemoglobin (Hb A1C)  Hypertension associated with diabetes (HCC)  OAB (overactive bladder)  Sleep apnea, unspecified type  Presbycusis of both ears I will increase his Jardiance.  Also recommend he get RSV and Tdap.  Continue on his other medications.  Recheck here in about 4 months.

## 2023-09-16 ENCOUNTER — Other Ambulatory Visit: Payer: Self-pay | Admitting: Family Medicine

## 2023-09-16 DIAGNOSIS — E118 Type 2 diabetes mellitus with unspecified complications: Secondary | ICD-10-CM

## 2023-10-12 ENCOUNTER — Ambulatory Visit: Payer: Medicare PPO | Admitting: Urology

## 2023-10-12 ENCOUNTER — Encounter: Payer: Self-pay | Admitting: Urology

## 2023-10-12 VITALS — BP 145/84 | HR 66

## 2023-10-12 DIAGNOSIS — N3281 Overactive bladder: Secondary | ICD-10-CM

## 2023-10-12 DIAGNOSIS — N529 Male erectile dysfunction, unspecified: Secondary | ICD-10-CM

## 2023-10-12 DIAGNOSIS — R351 Nocturia: Secondary | ICD-10-CM | POA: Diagnosis not present

## 2023-10-12 DIAGNOSIS — N401 Enlarged prostate with lower urinary tract symptoms: Secondary | ICD-10-CM

## 2023-10-12 DIAGNOSIS — N138 Other obstructive and reflux uropathy: Secondary | ICD-10-CM

## 2023-10-12 MED ORDER — TADALAFIL 5 MG PO TABS: 5.0000 mg | ORAL_TABLET | Freq: Every day | ORAL | 11 refills | Status: DC

## 2023-10-12 NOTE — Progress Notes (Signed)
 10/12/2023 11:59 AM   Jacob Swanson 30-Jan-1951 [id number]  Referring provider: Ronnald Nian, MD [address] [place],  [place] [address]  Followup BPH   HPI: Mr Jacob Swanson is a 73yo here for followup for BPh with nocturia and erectile dysfunction. IPSS 6 QOL 2 on no BPH therapy. He has stopped flomax 0.4mg  daily. Nocturia 3x. He has bothersome urinary frequency on occasion. Urine stream strong. He uses trimix 0.14mlk with good results. He also uses tadalafil 5mg  daily and a VED with good results.    PMH: Past Medical History:  Diagnosis Date   Diabetes mellitus    Dyslipidemia    ED (erectile dysfunction)    Hemorrhoids    Herpes genitalis in men    Hyperlipidemia    Hypertension    Sleep apnea     Surgical History: Past Surgical History:  Procedure Laterality Date   CARDIAC CATHETERIZATION  05/2006   COLONOSCOPY      Home Medications:  Allergies as of 10/12/2023   No Known Allergies      Medication List        Accurate as of October 12, 2023 11:59 AM. If you have any questions, ask your nurse or doctor.          Accu-Chek Aviva Plus test strip Generic drug: glucose blood USE 1 DAILY FOR SUGAR TESTING.   AMBULATORY NON FORMULARY MEDICATION 0.2 mLs by Intracavernosal route as needed. Medication Name: Trimix  PGE Pap 30mg  Phent 1mg    aspirin 81 MG tablet Take 81 mg by mouth daily.   BENADRYL PO Take by mouth.   co-enzyme Q-10 50 MG capsule Take 50 mg by mouth daily.   diclofenac Sodium 1 % Gel Commonly known as: VOLTAREN Apply topically 4 (four) times daily.   empagliflozin 25 MG Tabs tablet Commonly known as: Jardiance Take 1 tablet (25 mg total) by mouth daily before breakfast.   ezetimibe 10 MG tablet Commonly known as: Zetia Take 1 tablet (10 mg total) by mouth daily.   Ginkgo Biloba 120 MG Caps Take 1 capsule by mouth daily.   IBUPROFEN PO Take 500 mg by mouth.   metFORMIN 850 MG tablet Commonly known as:  GLUCOPHAGE Take 1 tablet (850 mg total) by mouth 2 (two) times daily with a meal.   multivitamin with minerals tablet Take 1 tablet by mouth daily.   Olmesartan-amLODIPine-HCTZ 20-5-12.5 MG Tabs Take 1 tablet by mouth daily.   rosuvastatin 40 MG tablet Commonly known as: CRESTOR Take 1 tablet (40 mg total) by mouth daily.   tadalafil 5 MG tablet Commonly known as: CIALIS Take 1 tablet (5 mg total) by mouth daily.   tadalafil 20 MG tablet Commonly known as: Cialis Take 1 tablet (20 mg total) by mouth as needed for erectile dysfunction.   tamsulosin 0.4 MG Caps capsule Commonly known as: FLOMAX Take 1 capsule (0.4 mg total) by mouth daily.   valACYclovir 1000 MG tablet Commonly known as: VALTREX TAKE 1 TABLET BY MOUTH TWICE A DAY        Allergies: No Known Allergies  Family History: Family History  Problem Relation Age of Onset   Diabetes Mother    Kidney disease Mother     Social History:  reports that he has never smoked. He has never used smokeless tobacco. He reports current alcohol use. He reports that he does not use drugs.  ROS: All other review of systems were reviewed and are negative except what is noted above in HPI  Physical Exam: BP (!) 145/84   Pulse 66   Constitutional:  Alert and oriented, No acute distress. HEENT: [place] AT, moist mucus membranes.  Trachea midline, no masses. Cardiovascular: No clubbing, cyanosis, or edema. Respiratory: Normal respiratory effort, no increased work of breathing. GI: Abdomen is soft, nontender, nondistended, no abdominal masses GU: No CVA tenderness.  Lymph: No cervical or inguinal lymphadenopathy. Skin: No rashes, bruises or suspicious lesions. Neurologic: Grossly intact, no focal deficits, moving all 4 extremities. Psychiatric: Normal mood and affect.  Laboratory Data: Lab Results  Component Value Date   WBC 5.3 10/28/2022   HGB 13.0 10/28/2022   HCT 38.3 10/28/2022   MCV 93 10/28/2022   PLT 215  10/28/2022    Lab Results  Component Value Date   CREATININE 1.28 (H) 10/28/2022    No results found for: "PSA"  Lab Results  Component Value Date   TESTOSTERONE 345 09/07/2018    Lab Results  Component Value Date   HGBA1C 7.6 (A) 07/13/2023    Urinalysis    Component Value Date/Time   APPEARANCEUR Clear 04/13/2023 1331   LABSPEC 1.030 09/08/2017 1200   GLUCOSEU Negative 04/13/2023 1331   BILIRUBINUR Negative 04/13/2023 1331   KETONESUR trace (5) (A) 09/08/2017 1200   PROTEINUR Negative 04/13/2023 1331   UROBILINOGEN negative 01/14/2016 1004   NITRITE Negative 04/13/2023 1331   LEUKOCYTESUR Negative 04/13/2023 1331    Lab Results  Component Value Date   LABMICR Comment 04/13/2023   WBCUA None seen 07/09/2020   LABEPIT None seen 07/09/2020   BACTERIA None seen 07/09/2020    Pertinent Imaging:  No results found for this or any previous visit.  No results found for this or any previous visit.  No results found for this or any previous visit.  No results found for this or any previous visit.  No results found for this or any previous visit.  No results found for this or any previous visit.  No results found for this or any previous visit.  No results found for this or any previous visit.   Assessment & Plan:    1. Benign prostatic hyperplasia with urinary obstruction (Primary) Tadaalfil 5mg  daily  2. Nocturia Tadalafil 5mg  daily  3. Erectile dysfunction, unspecified erectile dysfunction type Tadalafil 20mg  prn   No follow-ups on file.  Wilkie Aye, MD  [facility] [place]

## 2023-10-18 ENCOUNTER — Encounter: Payer: Self-pay | Admitting: Internal Medicine

## 2023-10-18 ENCOUNTER — Encounter: Payer: Self-pay | Admitting: Urology

## 2023-10-18 NOTE — Patient Instructions (Signed)

## 2023-11-02 ENCOUNTER — Ambulatory Visit: Payer: Medicare PPO | Admitting: Family Medicine

## 2023-11-23 ENCOUNTER — Ambulatory Visit: Payer: Medicare PPO | Admitting: Family Medicine

## 2023-11-23 VITALS — BP 122/80 | HR 68 | Ht 68.0 in | Wt 178.6 lb

## 2023-11-23 DIAGNOSIS — M1990 Unspecified osteoarthritis, unspecified site: Secondary | ICD-10-CM | POA: Diagnosis not present

## 2023-11-23 DIAGNOSIS — E118 Type 2 diabetes mellitus with unspecified complications: Secondary | ICD-10-CM

## 2023-11-23 DIAGNOSIS — Z1211 Encounter for screening for malignant neoplasm of colon: Secondary | ICD-10-CM

## 2023-11-23 DIAGNOSIS — Z23 Encounter for immunization: Secondary | ICD-10-CM

## 2023-11-23 DIAGNOSIS — H25013 Cortical age-related cataract, bilateral: Secondary | ICD-10-CM

## 2023-11-23 DIAGNOSIS — I152 Hypertension secondary to endocrine disorders: Secondary | ICD-10-CM

## 2023-11-23 DIAGNOSIS — E785 Hyperlipidemia, unspecified: Secondary | ICD-10-CM | POA: Diagnosis not present

## 2023-11-23 DIAGNOSIS — R972 Elevated prostate specific antigen [PSA]: Secondary | ICD-10-CM

## 2023-11-23 DIAGNOSIS — H4010X0 Unspecified open-angle glaucoma, stage unspecified: Secondary | ICD-10-CM

## 2023-11-23 DIAGNOSIS — Z0000 Encounter for general adult medical examination without abnormal findings: Secondary | ICD-10-CM

## 2023-11-23 DIAGNOSIS — I700 Atherosclerosis of aorta: Secondary | ICD-10-CM

## 2023-11-23 DIAGNOSIS — E1169 Type 2 diabetes mellitus with other specified complication: Secondary | ICD-10-CM | POA: Diagnosis not present

## 2023-11-23 DIAGNOSIS — N3281 Overactive bladder: Secondary | ICD-10-CM

## 2023-11-23 DIAGNOSIS — N5201 Erectile dysfunction due to arterial insufficiency: Secondary | ICD-10-CM | POA: Diagnosis not present

## 2023-11-23 DIAGNOSIS — E1159 Type 2 diabetes mellitus with other circulatory complications: Secondary | ICD-10-CM

## 2023-11-23 DIAGNOSIS — G4730 Sleep apnea, unspecified: Secondary | ICD-10-CM

## 2023-11-23 DIAGNOSIS — E1121 Type 2 diabetes mellitus with diabetic nephropathy: Secondary | ICD-10-CM | POA: Diagnosis not present

## 2023-11-23 LAB — POCT UA - MICROALBUMIN
Albumin/Creatinine Ratio, Urine, POC: 5.3
Creatinine, POC: 160.3 mg/dL
Microalbumin Ur, POC: 8.5 mg/L

## 2023-11-23 LAB — POCT GLYCOSYLATED HEMOGLOBIN (HGB A1C): Hemoglobin A1C: 7.9 % — AB (ref 4.0–5.6)

## 2023-11-23 LAB — LIPID PANEL

## 2023-11-23 NOTE — Patient Instructions (Signed)
  Mr. Mrozek , Thank you for taking time to come for your Medicare Wellness Visit. I appreciate your ongoing commitment to your health goals. Please review the following plan we discussed and let me know if I can assist you in the future.   These are the goals we discussed:  Goals      Patient Stated     08/20/2022, wants to lose a few pounds        This is a list of the screening recommended for you and due dates:  Health Maintenance  Topic Date Due   DTaP/Tdap/Td vaccine (3 - Td or Tdap) 08/08/2022   Complete foot exam   08/11/2022   COVID-19 Vaccine (4 - 2024-25 season) 04/24/2023   Cologuard (Stool DNA test)  08/28/2023   Yearly kidney function blood test for diabetes  10/28/2023   Yearly kidney health urinalysis for diabetes  10/28/2023   Hemoglobin A1C  01/10/2024   Flu Shot  03/23/2024   Eye exam for diabetics  04/06/2024   Medicare Annual Wellness Visit  11/22/2024   Hepatitis C Screening  Completed   Zoster (Shingles) Vaccine  Completed   HPV Vaccine  Aged Out   Pneumonia Vaccine  Discontinued

## 2023-11-23 NOTE — Progress Notes (Signed)
 Annual Wellness Visit     Patient: Jacob Swanson, Male    DOB: 03-19-1951, 73 y.o.   MRN: [id number]  Subjective  Chief Complaint  Patient presents with   Medicare Wellness    Medicare well visit. Right Knee has been giving him a little pain at different times but does not mess with his day to day activities, goes to pt, and a lot of workouts to help. He would like to have a cologaurd.     Yunis Voorheis is a 73 y.o. male who presents today for his Annual Wellness Visit. He reports consuming a general diet. Gym/ health club routine includes also works outside doing yard work. He generally feels well. He reports sleeping fairly well. He does not have additional problems to discuss today.  He continues to have some slight right knee pain but treats it with exercise and Voltaren which seems to do fairly well.  He is doing well with his CPAP and gets good readouts on that.  Continues to use Cialis on an as-needed basis.  He is also taking Jardiance and metformin and having no trouble with that.  Flomax continues to help with his OAB.  He is taking Zetia and Crestor for his cholesterol.  Continues on losartan/HCTZ/amlodipine.  He is wearing hearing aids and does follow-up with ophthalmology concerning He is retired.  He exercises regularly and is enjoying his retirement.  He has apparently received a Tdap and RSV at the drugstore.       Medications: Outpatient Medications Prior to Visit  Medication Sig Note   ACCU-CHEK AVIVA PLUS test strip USE 1 DAILY FOR SUGAR TESTING.    AMBULATORY NON FORMULARY MEDICATION 0.2 mLs by Intracavernosal route as needed. Medication Name: Trimix  PGE Pap 30mg  Phent 1mg     aspirin 81 MG tablet Take 81 mg by mouth daily.    co-enzyme Q-10 50 MG capsule Take 50 mg by mouth daily.    diclofenac Sodium (VOLTAREN) 1 % GEL Apply topically 4 (four) times daily.    diphenhydrAMINE HCl (BENADRYL PO) Take by mouth.    empagliflozin (JARDIANCE) 25 MG TABS tablet  Take 1 tablet (25 mg total) by mouth daily before breakfast.    ezetimibe (ZETIA) 10 MG tablet Take 1 tablet (10 mg total) by mouth daily.    Ginkgo Biloba 120 MG CAPS Take 1 capsule by mouth daily.    IBUPROFEN PO Take 500 mg by mouth.    metFORMIN (GLUCOPHAGE) 850 MG tablet Take 1 tablet (850 mg total) by mouth 2 (two) times daily with a meal.    Multiple Vitamins-Minerals (MULTIVITAMIN WITH MINERALS) tablet Take 1 tablet by mouth daily.    Olmesartan-amLODIPine-HCTZ 20-5-12.5 MG TABS Take 1 tablet by mouth daily.    rosuvastatin (CRESTOR) 40 MG tablet Take 1 tablet (40 mg total) by mouth daily.    tadalafil (CIALIS) 20 MG tablet Take 1 tablet (20 mg total) by mouth as needed for erectile dysfunction.    tadalafil (CIALIS) 5 MG tablet Take 1 tablet (5 mg total) by mouth daily.    tamsulosin (FLOMAX) 0.4 MG CAPS capsule Take 1 capsule (0.4 mg total) by mouth daily.    valACYclovir (VALTREX) 1000 MG tablet TAKE 1 TABLET BY MOUTH TWICE A DAY (Patient not taking: Reported on 11/23/2023) 11/23/2023: As needed   No facility-administered medications prior to visit.    No Known Allergies  Patient Care Team: Ronnald Nian, MD as PCP - General (Family Medicine) Eye: [facility] eye  accoicates appointment on Monday 11/28/23 [place]; sherry log stokes last visits in march 2025 GI: last one done 5 years ago   Review of Systems  All other systems reviewed and are negative. Advanced directive information given.      Objective    Physical Exam Alert and in no distress. Tympanic membranes and canals are normal. Pharyngeal area is normal. Neck is supple without adenopathy or thyromegaly. Cardiac exam shows a regular sinus rhythm without murmurs or gallops. Lungs are clear to auscultation.  Abdominal exam shows no masses or tenderness. Hemoglobin A1c is 7.9   Most recent functional status assessment:    11/23/2023   11:12 AM  In your present state of health, do you have any difficulty performing the  following activities:  Hearing? 1  Comment B/L hearing aids  Vision? 0  Difficulty concentrating or making decisions? 0  Walking or climbing stairs? 0  Dressing or bathing? 0  Doing errands, shopping? 0  Preparing Food and eating ? N  Using the Toilet? N  In the past six months, have you accidently leaked urine? Y  Do you have problems with loss of bowel control? N  Managing your Medications? N  Managing your Finances? N  Housekeeping or managing your Housekeeping? N   Most recent fall risk assessment:    11/23/2023   11:11 AM  Fall Risk   Falls in the past year? 0  Number falls in past yr: 0  Injury with Fall? 0  Risk for fall due to : No Fall Risks  Follow up Falls evaluation completed    Most recent depression screenings:    11/23/2023   11:11 AM 07/13/2023   11:35 AM  PHQ 2/9 Scores  PHQ - 2 Score 0 0   Most recent cognitive screening:    08/20/2022    8:20 AM  6CIT Screen  What Year? 0 points  What month? 0 points  What time? 0 points  Count back from 20 0 points  Months in reverse 0 points  Repeat phrase 0 points  Total Score 0 points   Most recent Audit-C alcohol use screening     No data to display         A score of 3 or more in women, and 4 or more in men indicates increased risk for alcohol abuse, EXCEPT if all of the points are from question 1   Vision/Hearing Screen: No results found.    Appt 11/28/2023   Assessment & Plan  Cortical age-related cataract of both eyes  Arthritis  Diabetic nephropathy associated with type 2 diabetes mellitus (HCC)  Type 2 diabetes mellitus with complications (HCC) - Plan: CBC with Differential/Platelet, Comprehensive metabolic panel with GFR, POCT glycosylated hemoglobin (Hb A1C), POCT UA - Microalbumin  Sleep apnea, unspecified type  OAB (overactive bladder)  Hypertension associated with diabetes (HCC) - Plan: CBC with Differential/Platelet, Comprehensive metabolic panel with GFR  Erectile  dysfunction due to arterial insufficiency  Aortic atherosclerosis (HCC)  Open-angle glaucoma of both eyes, unspecified glaucoma stage, unspecified open-angle glaucoma type  Need for vaccination against Streptococcus pneumoniae - Plan: Pneumococcal conjugate vaccine 20-valent (Prevnar 20)  Hyperlipidemia associated with type 2 diabetes mellitus (HCC) - Plan: Lipid panel  Colon cancer screening - Plan: Cologuard  Routine general medical examination at a health care facility No I do not know what number about been trying Aleve at all Annual wellness visit done today including the all of the following: Reviewed patient's Family Medical History Reviewed  and updated list of patient's medical providers Assessment of cognitive impairment was done Assessed patient's functional ability Established a written schedule for health screening services Health Risk Assessent Completed and Reviewed  Exercise Activities and Dietary recommendations  Goals      Patient Stated     08/20/2022, wants to lose a few pounds        Immunization History  Administered Date(s) Administered   Fluad Quad(high Dose 65+) 04/13/2019   Influenza Whole 07/07/2009, 04/21/2011   Influenza, High Dose Seasonal PF 05/09/2017, 06/09/2018   Influenza, Seasonal, Injecte, Preservative Fre 08/08/2012   Influenza,inj,Quad PF,6+ Mos 08/10/2013, 04/14/2015   Influenza-Unspecified 04/23/2014, 05/12/2016   PFIZER(Purple Top)SARS-COV-2 Vaccination 09/27/2019, 10/18/2019, 06/07/2020   PNEUMOCOCCAL CONJUGATE-20 11/23/2023   Pneumococcal Conjugate-13 09/10/2014   Pneumococcal Polysaccharide-23 05/19/2005   Td 04/22/2003   Tdap 08/08/2012   Zoster Recombinant(Shingrix) 05/11/2017, 09/08/2017   Zoster, Live 07/13/2011    Health Maintenance  Topic Date Due   DTaP/Tdap/Td (3 - Td or Tdap) 08/08/2022   FOOT EXAM  08/11/2022   COVID-19 Vaccine (4 - 2024-25 season) 04/24/2023   Fecal DNA (Cologuard)  08/28/2023   Diabetic  kidney evaluation - eGFR measurement  10/28/2023   Diabetic kidney evaluation - Urine ACR  10/28/2023   HEMOGLOBIN A1C  01/10/2024   INFLUENZA VACCINE  03/23/2024   OPHTHALMOLOGY EXAM  04/06/2024   Medicare Annual Wellness (AWV)  11/22/2024   Hepatitis C Screening  Completed   Zoster Vaccines- Shingrix  Completed   HPV VACCINES  Aged Out   Pneumonia Vaccine 61+ Years old  Discontinued       Problem List Items Addressed This Visit     Age-related cataract of both eyes - Primary   Aortic atherosclerosis (HCC)   Arthritis   Diabetic nephropathy associated with type 2 diabetes mellitus (HCC)   ED (erectile dysfunction)   Glaucoma   Hyperlipidemia associated with type 2 diabetes mellitus (HCC)   Relevant Orders   Lipid panel   Hypertension associated with diabetes (HCC)   Relevant Orders   CBC with Differential/Platelet   Comprehensive metabolic panel with GFR   OAB (overactive bladder)   Sleep apnea   Type 2 diabetes mellitus with complications (HCC)   Relevant Orders   CBC with Differential/Platelet   Comprehensive metabolic panel with GFR   POCT glycosylated hemoglobin (Hb A1C)   POCT UA - Microalbumin   Other Visit Diagnoses       Need for vaccination against Streptococcus pneumoniae       Relevant Orders   Pneumococcal conjugate vaccine 20-valent (Prevnar 20) (Completed)     Colon cancer screening       Relevant Orders   Cologuard     Routine general medical examination at a health care facility         Discussed adding on a medication, specifically GLP-1 to his regimen but he would like to wait another 4 months before we decide this.  Return in about 4 months (around 03/24/2024).     Sharlot Gowda, MD

## 2023-11-24 ENCOUNTER — Encounter: Payer: Self-pay | Admitting: Family Medicine

## 2023-11-24 LAB — CBC WITH DIFFERENTIAL/PLATELET
Basophils Absolute: 0 10*3/uL (ref 0.0–0.2)
Basos: 1 %
EOS (ABSOLUTE): 0.1 10*3/uL (ref 0.0–0.4)
Eos: 1 %
Hematocrit: 39.2 % (ref 37.5–51.0)
Hemoglobin: 13.1 g/dL (ref 13.0–17.7)
Immature Grans (Abs): 0 10*3/uL (ref 0.0–0.1)
Immature Granulocytes: 0 %
Lymphocytes Absolute: 2.4 10*3/uL (ref 0.7–3.1)
Lymphs: 36 %
MCH: 31.4 pg (ref 26.6–33.0)
MCHC: 33.4 g/dL (ref 31.5–35.7)
MCV: 94 fL (ref 79–97)
Monocytes Absolute: 0.4 10*3/uL (ref 0.1–0.9)
Monocytes: 6 %
Neutrophils Absolute: 3.7 10*3/uL (ref 1.4–7.0)
Neutrophils: 56 %
Platelets: 237 10*3/uL (ref 150–450)
RBC: 4.17 x10E6/uL (ref 4.14–5.80)
RDW: 12.4 % (ref 11.6–15.4)
WBC: 6.6 10*3/uL (ref 3.4–10.8)

## 2023-11-24 LAB — COMPREHENSIVE METABOLIC PANEL WITH GFR
ALT: 18 IU/L (ref 0–44)
AST: 18 IU/L (ref 0–40)
Albumin: 4.6 g/dL (ref 3.8–4.8)
Alkaline Phosphatase: 90 IU/L (ref 44–121)
BUN/Creatinine Ratio: 22 (ref 10–24)
BUN: 36 mg/dL — ABNORMAL HIGH (ref 8–27)
Bilirubin Total: 0.4 mg/dL (ref 0.0–1.2)
CO2: 22 mmol/L (ref 20–29)
Calcium: 9.8 mg/dL (ref 8.6–10.2)
Chloride: 102 mmol/L (ref 96–106)
Creatinine, Ser: 1.67 mg/dL — ABNORMAL HIGH (ref 0.76–1.27)
Globulin, Total: 2.9 g/dL (ref 1.5–4.5)
Glucose: 126 mg/dL — ABNORMAL HIGH (ref 70–99)
Potassium: 4.7 mmol/L (ref 3.5–5.2)
Sodium: 140 mmol/L (ref 134–144)
Total Protein: 7.5 g/dL (ref 6.0–8.5)
eGFR: 43 mL/min/{1.73_m2} — ABNORMAL LOW (ref >59)

## 2023-11-24 LAB — LIPID PANEL
Cholesterol, Total: 148 mg/dL (ref 100–199)
HDL: 50 mg/dL (ref >39)
LDL CALC COMMENT:: 3 ratio (ref 0.0–5.0)
LDL Chol Calc (NIH): 85 mg/dL (ref 0–99)
Triglycerides: 66 mg/dL (ref 0–149)
VLDL Cholesterol Cal: 13 mg/dL (ref 5–40)

## 2023-11-28 ENCOUNTER — Encounter: Payer: Self-pay | Admitting: Family Medicine

## 2023-11-28 DIAGNOSIS — H40013 Open angle with borderline findings, low risk, bilateral: Secondary | ICD-10-CM | POA: Diagnosis not present

## 2023-11-28 DIAGNOSIS — E119 Type 2 diabetes mellitus without complications: Secondary | ICD-10-CM | POA: Diagnosis not present

## 2023-11-28 DIAGNOSIS — H35033 Hypertensive retinopathy, bilateral: Secondary | ICD-10-CM | POA: Diagnosis not present

## 2023-11-28 DIAGNOSIS — H25813 Combined forms of age-related cataract, bilateral: Secondary | ICD-10-CM | POA: Diagnosis not present

## 2023-11-28 LAB — HM DIABETES EYE EXAM

## 2023-11-30 DIAGNOSIS — Z1211 Encounter for screening for malignant neoplasm of colon: Secondary | ICD-10-CM | POA: Diagnosis not present

## 2023-12-07 LAB — COLOGUARD: COLOGUARD: NEGATIVE

## 2023-12-11 ENCOUNTER — Other Ambulatory Visit: Payer: Self-pay | Admitting: Family Medicine

## 2023-12-11 DIAGNOSIS — I152 Hypertension secondary to endocrine disorders: Secondary | ICD-10-CM

## 2024-03-28 ENCOUNTER — Ambulatory Visit: Admitting: Family Medicine

## 2024-03-28 ENCOUNTER — Encounter: Payer: Self-pay | Admitting: Family Medicine

## 2024-03-28 VITALS — BP 132/70 | HR 70 | Ht 68.5 in | Wt 179.0 lb

## 2024-03-28 DIAGNOSIS — E1159 Type 2 diabetes mellitus with other circulatory complications: Secondary | ICD-10-CM | POA: Diagnosis not present

## 2024-03-28 DIAGNOSIS — E785 Hyperlipidemia, unspecified: Secondary | ICD-10-CM

## 2024-03-28 DIAGNOSIS — E1169 Type 2 diabetes mellitus with other specified complication: Secondary | ICD-10-CM | POA: Diagnosis not present

## 2024-03-28 DIAGNOSIS — E1121 Type 2 diabetes mellitus with diabetic nephropathy: Secondary | ICD-10-CM

## 2024-03-28 DIAGNOSIS — I152 Hypertension secondary to endocrine disorders: Secondary | ICD-10-CM

## 2024-03-28 DIAGNOSIS — E118 Type 2 diabetes mellitus with unspecified complications: Secondary | ICD-10-CM | POA: Diagnosis not present

## 2024-03-28 DIAGNOSIS — H25013 Cortical age-related cataract, bilateral: Secondary | ICD-10-CM

## 2024-03-28 LAB — POCT GLYCOSYLATED HEMOGLOBIN (HGB A1C): Hemoglobin A1C: 7 % — AB (ref 4.0–5.6)

## 2024-03-28 NOTE — Progress Notes (Signed)
   Subjective:    Patient ID: Jacob Swanson, male    DOB: 1951/08/11, 73 y.o.   MRN: [id number]  Discussed the use of AI scribe software for clinical note transcription with the patient, who gave verbal consent to proceed.  History of Present Illness   Jacob Swanson is a 73 year old male with diabetes who presents for a follow-up visit.  He is managing his diabetes with metformin . He previously experienced an adverse reaction to Jardiance , which caused phimosis and perianal soreness, consistent with a urinary tract fungal infection, leading to its discontinuation. His recent A1c level is 7.0, improved from previous levels of 7.9 and 7.6, attributed to better dietary habits and regular exercise.  He manages his blood pressure with olmesartan  and amlodipine , and his cholesterol with Crestor  and Zetia . For erectile dysfunction, he uses Stendra as needed, having stopped using Cialis  when taking Stendra. For bladder issues, he switched from tamsulosin  to Uroxatral  (alfuzosin ) to reduce urinary frequency.  He takes an 81 mg aspirin daily. He continues to stay physically active and is satisfied with his current regimen.           Review of Systems     Objective:    Physical Exam Physical Exam    Alert and in no distress.  Hemoglobin A1c is 7.0           Assessment & Plan:  Assessment and Plan    Type 2 diabetes mellitus Diabetes well-controlled with A1c of 7.0. Managed with metformin  and lifestyle changes. - Continue metformin . - Encourage healthy diet and regular exercise.  Adverse effect of SGLT2 inhibitor (Jardiance ) Experienced adverse effects from Jardiance , consistent with urinary tract fungal infection. Discontinued due to side effects. - Discontinue Jardiance . - Avoid SGLT2 inhibitors in the future.  Hypertension Managed with olmesartan  and amlodipine . - Continue olmesartan  and amlodipine .  Hyperlipidemia Managed with Crestor  and Zetia . - Continue Crestor  and  Zetia .  Benign prostatic hyperplasia with lower urinary tract symptoms Managed with Uroxatral  after switching from tamsulosin . - Discontinue tamsulosin . - Continue Uroxatral  (alfuzosin ).  Erectile dysfunction Managed with Stendra, used as needed. Alternates with Cialis , not used simultaneously. - Continue Stendra as needed. - Avoid concurrent use of Stendra and Cialis .  General Health Maintenance Taking 81 mg aspirin daily for cardiovascular protection due to diabetes and age. - Continue 81 mg aspirin daily.      Return here in 4 months

## 2024-04-06 ENCOUNTER — Other Ambulatory Visit: Payer: Medicare PPO

## 2024-04-06 DIAGNOSIS — N138 Other obstructive and reflux uropathy: Secondary | ICD-10-CM

## 2024-04-06 DIAGNOSIS — N401 Enlarged prostate with lower urinary tract symptoms: Secondary | ICD-10-CM | POA: Diagnosis not present

## 2024-04-07 LAB — PSA: Prostate Specific Ag, Serum: 2.3 ng/mL (ref 0.0–4.0)

## 2024-04-10 ENCOUNTER — Ambulatory Visit: Payer: Self-pay | Admitting: Urology

## 2024-04-13 ENCOUNTER — Ambulatory Visit: Payer: Medicare PPO | Admitting: Urology

## 2024-04-13 ENCOUNTER — Encounter: Payer: Self-pay | Admitting: Urology

## 2024-04-13 VITALS — BP 112/56 | HR 73

## 2024-04-13 DIAGNOSIS — N138 Other obstructive and reflux uropathy: Secondary | ICD-10-CM | POA: Diagnosis not present

## 2024-04-13 DIAGNOSIS — N529 Male erectile dysfunction, unspecified: Secondary | ICD-10-CM

## 2024-04-13 DIAGNOSIS — R351 Nocturia: Secondary | ICD-10-CM | POA: Diagnosis not present

## 2024-04-13 DIAGNOSIS — N401 Enlarged prostate with lower urinary tract symptoms: Secondary | ICD-10-CM

## 2024-04-13 LAB — URINALYSIS, ROUTINE W REFLEX MICROSCOPIC
Bilirubin, UA: NEGATIVE
Glucose, UA: NEGATIVE
Ketones, UA: NEGATIVE
Leukocytes,UA: NEGATIVE
Nitrite, UA: NEGATIVE
Protein,UA: NEGATIVE
RBC, UA: NEGATIVE
Specific Gravity, UA: 1.02 (ref 1.005–1.030)
Urobilinogen, Ur: 1 mg/dL (ref 0.2–1.0)
pH, UA: 6 (ref 5.0–7.5)

## 2024-04-13 MED ORDER — TADALAFIL 20 MG PO TABS
20.0000 mg | ORAL_TABLET | ORAL | 3 refills | Status: AC | PRN
Start: 2024-04-13 — End: ?

## 2024-04-13 MED ORDER — TADALAFIL 5 MG PO TABS: 5.0000 mg | ORAL_TABLET | Freq: Every day | ORAL | 11 refills | Status: AC

## 2024-04-13 MED ORDER — AMBULATORY NON FORMULARY MEDICATION: 0.2000 mL | 5 refills | Status: DC | PRN

## 2024-04-13 NOTE — Progress Notes (Signed)
 04/13/2024 12:29 PM   Jacob Swanson Dec 05, 1950 [id number]  Referring provider: Joyce Norleen BROCKS, MD [address] [place],  [place] [address]  Followup BPh and ED   HPI: Jacob Swanson is a 73yo here for followup for erectile dysfunction and BPH with nocturia. PSA 2.3. IPSS 7 QOL 2 on tadalafil  5mg  daily. Nocturia 2-3x. Urine stream strong. No straining to urinate. He uses tadalafil  20mg  prn with fair results.  He injects 0.7ml trimix with good results   PMH: Past Medical History:  Diagnosis Date   Diabetes mellitus    Dyslipidemia    ED (erectile dysfunction)    Hemorrhoids    Herpes genitalis in men    Hyperlipidemia    Hypertension    Sleep apnea     Surgical History: Past Surgical History:  Procedure Laterality Date   CARDIAC CATHETERIZATION  05/2006   COLONOSCOPY      Home Medications:  Allergies as of 04/13/2024       Reactions   Jardiance  [empagliflozin ] Other (See Comments)   Caused issues with penis and anus. Tightness in skin        Medication List        Accurate as of April 13, 2024 12:29 PM. If you have any questions, ask your nurse or doctor.          Accu-Chek Aviva Plus test strip Generic drug: glucose blood USE 1 DAILY FOR SUGAR TESTING.   AMBULATORY NON FORMULARY MEDICATION 0.2 mLs by Intracavernosal route as needed. Medication Name: Trimix  PGE 30mcg Pap 30mg  Phent 1mg    aspirin 81 MG tablet Take 81 mg by mouth daily.   BENADRYL PO Take by mouth.   co-enzyme Q-10 50 MG capsule Take 50 mg by mouth daily.   diclofenac Sodium 1 % Gel Commonly known as: VOLTAREN Apply topically 4 (four) times daily.   ezetimibe  10 MG tablet Commonly known as: Zetia  Take 1 tablet (10 mg total) by mouth daily.   Ginkgo Biloba 120 MG Caps Take 1 capsule by mouth daily.   IBUPROFEN PO Take 500 mg by mouth.   metFORMIN  850 MG tablet Commonly known as: GLUCOPHAGE  Take 1 tablet (850 mg total) by mouth 2 (two) times daily with a  meal.   multivitamin with minerals tablet Take 1 tablet by mouth daily.   Olmesartan -amLODIPine -HCTZ 20-5-12.5 MG Tabs TAKE 1 TABLET BY MOUTH EVERY DAY   rosuvastatin  40 MG tablet Commonly known as: CRESTOR  Take 1 tablet (40 mg total) by mouth daily.   tadalafil  20 MG tablet Commonly known as: Cialis  Take 1 tablet (20 mg total) by mouth as needed for erectile dysfunction.   tadalafil  5 MG tablet Commonly known as: CIALIS  Take 1 tablet (5 mg total) by mouth daily.   valACYclovir  1000 MG tablet Commonly known as: VALTREX  TAKE 1 TABLET BY MOUTH TWICE A DAY        Allergies:  Allergies  Allergen Reactions   Jardiance  [Empagliflozin ] Other (See Comments)    Caused issues with penis and anus. Tightness in skin    Family History: Family History  Problem Relation Age of Onset   Diabetes Mother    Kidney disease Mother     Social History:  reports that he has never smoked. He has never used smokeless tobacco. He reports current alcohol use. He reports that he does not use drugs.  ROS: All other review of systems were reviewed and are negative except what is noted above in HPI  Physical Exam: BP ROLLEN)  112/56   Pulse 73   Constitutional:  Alert and oriented, No acute distress. HEENT: [place] AT, moist mucus membranes.  Trachea midline, no masses. Cardiovascular: No clubbing, cyanosis, or edema. Respiratory: Normal respiratory effort, no increased work of breathing. GI: Abdomen is soft, nontender, nondistended, no abdominal masses GU: No CVA tenderness.  Lymph: No cervical or inguinal lymphadenopathy. Skin: No rashes, bruises or suspicious lesions. Neurologic: Grossly intact, no focal deficits, moving all 4 extremities. Psychiatric: Normal mood and affect.  Laboratory Data: Lab Results  Component Value Date   WBC 6.6 11/23/2023   HGB 13.1 11/23/2023   HCT 39.2 11/23/2023   MCV 94 11/23/2023   PLT 237 11/23/2023    Lab Results  Component Value Date   CREATININE  1.67 (H) 11/23/2023    No results found for: PSA  Lab Results  Component Value Date   TESTOSTERONE  345 09/07/2018    Lab Results  Component Value Date   HGBA1C 7.0 (A) 03/28/2024    Urinalysis    Component Value Date/Time   APPEARANCEUR Clear 04/13/2023 1331   LABSPEC 1.030 09/08/2017 1200   GLUCOSEU Negative 04/13/2023 1331   BILIRUBINUR Negative 04/13/2023 1331   KETONESUR trace (5) (A) 09/08/2017 1200   PROTEINUR Negative 04/13/2023 1331   UROBILINOGEN negative 01/14/2016 1004   NITRITE Negative 04/13/2023 1331   LEUKOCYTESUR Negative 04/13/2023 1331    Lab Results  Component Value Date   LABMICR Comment 04/13/2023   WBCUA None seen 07/09/2020   LABEPIT None seen 07/09/2020   BACTERIA None seen 07/09/2020    Pertinent Imaging:  No results found for this or any previous visit.  No results found for this or any previous visit.  No results found for this or any previous visit.  No results found for this or any previous visit.  No results found for this or any previous visit.  No results found for this or any previous visit.  No results found for this or any previous visit.  No results found for this or any previous visit.   Assessment & Plan:    1. Benign prostatic hyperplasia with urinary obstruction (Primary) -continue tadalafil  5mg  daily - Urinalysis, Routine w reflex microscopic  2. Nocturia Continue tadalafil  5mg  daily  3. Erectile dysfunction, unspecified erectile dysfunction type Continue tadalafil  20mg  prn and trimix prn   No follow-ups on file.  Belvie Clara, MD  [facility] 

## 2024-04-13 NOTE — Patient Instructions (Signed)

## 2024-05-17 ENCOUNTER — Ambulatory Visit: Admitting: Family Medicine

## 2024-05-17 ENCOUNTER — Encounter: Payer: Self-pay | Admitting: Family Medicine

## 2024-05-17 VITALS — BP 126/72 | HR 67 | Ht 68.5 in | Wt 167.8 lb

## 2024-05-17 DIAGNOSIS — T783XXA Angioneurotic edema, initial encounter: Secondary | ICD-10-CM

## 2024-05-17 NOTE — Progress Notes (Signed)
   Subjective:    Patient ID: Jacob Swanson, male    DOB: 01-28-1951, 73 y.o.   MRN: [id number]  HPI He states that on September 17 he had had some dental work done and is in the process of having a tooth replaced.  He will have a post that he will need to leave in place for several months.  He was given penicillin and did develop a rash.  He did show me a picture of the rash.  He stopped taking the penicillin however the swelling reoccurred again yesterday.   Review of Systems     Objective:    Physical Exam Alert and in no distress.  The photos were reviewed.  They do show evidence of angioedema.       Assessment & Plan:  Angioedema, initial encounter I explained that since the angioedema occurred again after he stopped taking the penicillin, he is probably not truly allergic to penicillin.  Recommend cool compresses and Benadryl if this occurs again and to pay attention to anything that might possibly be associated with it including foods or any medications.  I did review his medications with him and he is not on an ACE inhibitor.  At this point there is no good etiology as to what was causing the angioedema.

## 2024-05-31 ENCOUNTER — Ambulatory Visit: Admitting: Family Medicine

## 2024-05-31 ENCOUNTER — Ambulatory Visit: Payer: Self-pay

## 2024-05-31 ENCOUNTER — Encounter: Payer: Self-pay | Admitting: Family Medicine

## 2024-05-31 VITALS — BP 148/78 | HR 60 | Ht 68.0 in | Wt 173.0 lb

## 2024-05-31 DIAGNOSIS — T783XXA Angioneurotic edema, initial encounter: Secondary | ICD-10-CM

## 2024-05-31 NOTE — Progress Notes (Signed)
   Subjective:    Patient ID: Jacob Swanson, male    DOB: [birth year], 73 y.o.   MRN: [id number]  Discussed the use of AI scribe software for clinical note transcription with the patient, who gave verbal consent to proceed.  History of Present Illness   Jacob Swanson is a 73 year old male who presents with tongue swelling.  He woke up at 1:00 AM with a swollen tongue, describing it as feeling 'fat'. He initially suspected an allergic reaction and took Benadryl, which did not alleviate the swelling. By 8:00 AM, he contacted the office to report the issue and sought an appointment.  He has a history of edema and has experienced daily episodes of swelling since September 25th, which he previously thought might be related to penicillin. However, he has not taken penicillin for a while, and the swelling episodes have persisted. These episodes typically occur on his face, around his eyes, under his neck, and on the back of his neck, but they usually resolve by the next day. These episodes began after a dental extraction and bone graft procedure.  He has not noticed any tick bites but occasionally wakes up with bite marks. He spends time outdoors cutting grass, usually wearing long sleeves, and sometimes experiences irritation afterward.  He is currently taking olmesartan  for blood pressure management. He has not been taking any pain medication and has been trying to avoid Benadryl. No current swelling elsewhere on his body today, aside from the tongue swelling.           Review of Systems     Objective:    Physical Exam Physical Exam   HEENT: Left side of tongue swollen.     TMs and throat clear.  Neck is supple without adenopathy.       Assessment & Plan:  Angioedema Acute tongue swelling with recurrent episodes. Possible allergic reaction to olmesartan  or alpha-gal syndrome. - Order alpha-gal test. - Hold olmesartan  to assess causality.  Hypertension Management on hold due to  potential olmesartan -induced angioedema. - Hold olmesartan .

## 2024-05-31 NOTE — Telephone Encounter (Signed)
 FYI Only or Action Required?: Action required by provider: request for appointment.  Patient was last seen in primary care on 05/17/2024 by Joyce Norleen BROCKS, MD.  Called Nurse Triage reporting Oral Swelling.  Symptoms began yesterday.  Interventions attempted: Nothing.  Symptoms are: stable.Last night noticed left side of tongue swollen. Seen 05/17/24 for angioedema. Spoke with Rosina in practice and appointment made. Instruced pt. To call 911 for any worsening of symptom.  Triage Disposition: See PCP When Office is Open (Within 3 Days)  Patient/caregiver understands and will follow disposition?: Yes    Copied from CRM #[id number]. Topic: Clinical - Red Word Triage >> May 31, 2024  8:30 AM Carlyon D wrote: Red Word that prompted transfer to Nurse Triage: Pt felt funny last night, pt woke up with tongue swollen pt took benadryl this morning and it did not help at all Reason for Disposition  [1] Tongue swelling is a recurrent problem (e.g., comes and goes) AND [2] no swelling at present  Answer Assessment - Initial Assessment Questions 1. SYMPTOM: What's the main symptom you're concerned about? (e.g., chapped lips, dry mouth, lump, sores)     Tongue swelling 2. ONSET: When did the    start?     yesterday 3. PAIN: Is there any pain? If Yes, ask: How bad is it? (Scale: 0-10; or none, mild, moderate, severe)     no 4. CAUSE: What do you think is causing the symptoms?     unsure 5. OTHER SYMPTOMS: Do you have any other symptoms? (e.g., fever, sore throat, toothache, swelling)     no 6. PREGNANCY: Is there any chance you are pregnant? When was your last menstrual period?     N/a  Answer Assessment - Initial Assessment Questions 1. ONSET: When did the swelling start? (e.g., minutes, hours, days)     Last night 2. LOCATION: What part of the tongue is swollen?     Left side 3. SEVERITY: How swollen is it?     moderate 4. CAUSE: What do you think is causing the tongue  swelling? (e.g., history of angioedema, allergies)     I have mouth swelling going on 5. RECURRENT SYMPTOM: Have you had tongue swelling before? If Yes, ask: When was the last time? What happened that time?     yes 6. OTHER SYMPTOMS: Do you have any other symptoms? (e.g., breathing difficulty, facial swelling)     no 7. PREGNANCY: Is there any chance you are pregnant? When was your last menstrual period?     N/a  Protocols used: Mouth Symptoms-A-AH, Tongue Swelling-A-AH

## 2024-06-03 LAB — ALPHA-GAL PANEL
Allergen Lamb IgE: <0.1 kU/L
Beef IgE: <0.1 kU/L
IgE (Immunoglobulin E), Serum: 209 [IU]/mL (ref 6–495)
O215-IgE Alpha-Gal: <0.1 kU/L
Pork IgE: <0.1 kU/L

## 2024-06-05 ENCOUNTER — Ambulatory Visit: Payer: Self-pay

## 2024-06-06 ENCOUNTER — Encounter: Payer: Self-pay | Admitting: Family Medicine

## 2024-06-06 ENCOUNTER — Ambulatory Visit: Admitting: Family Medicine

## 2024-06-06 VITALS — BP 136/72 | HR 58 | Ht 68.0 in | Wt 175.8 lb

## 2024-06-06 DIAGNOSIS — E1159 Type 2 diabetes mellitus with other circulatory complications: Secondary | ICD-10-CM

## 2024-06-06 DIAGNOSIS — I152 Hypertension secondary to endocrine disorders: Secondary | ICD-10-CM | POA: Diagnosis not present

## 2024-06-06 DIAGNOSIS — Z91014 Allergy to mammalian meats: Secondary | ICD-10-CM

## 2024-06-06 NOTE — Patient Instructions (Signed)
 Alpha-gal Syndrome Alpha-gal syndrome (AGS) is an allergic reaction to a type of sugar commonly called alpha-gal. It is found in the meat and organ meats of mammals, such as cows, pigs, and sheep. It may also be found in products that come from animals, such as gelatin, medicines, medicine capsules, some milk products, vaccines, and cosmetics. AGS causes an allergic reaction that can be immediate or delayed for several hours and can range from mild to severe. A mild reaction may cause nausea, vomiting, or an itchy rash (hives). A severe reaction can cause breathing difficulties or loss of consciousness (anaphylaxis). This can be life-threatening. What are the causes? This allergy is first triggered by a tick bite from a lone star or blackleg tick. These ticks bite animals, such as cows, pigs, or sheep, and pick up the alpha-gal sugar from their blood. If the same tick bites you, it may cause your body's defense system (immune system) to produce antibodies to alpha-gal and cause the allergic reaction. What increases the risk? People who live in areas of the [place] where the lone star tick is common are at highest risk, these areas may include: Southeastern. [place]. [place] into parts of [place]. People who are hunters and people who work in jobs that involve caring for trees (foresters) have an increased risk of this condition. What are the signs or symptoms? AGS may not cause an allergic reaction every time you eat red meat or come into contact with alpha-gal. If you do have a reaction, symptoms may include: Hives. Severe stomachache. Nausea or vomiting. Swelling of the lips, face, tongue, or throat. Making a high-pitched whistling sound when you breathe, most often when you breathe out (wheezing). Sneezing and runny nose. Headache. Symptoms of anaphylaxis may include: Difficulty breathing. Difficulty swallowing. Dizziness. Fainting. This may happen due to a sudden drop in  blood pressure. You may have AGS if you had anaphylaxis after eating something but do not have any known food allergies. Unlike other food allergies, the reaction does not start soon after the exposure to alpha-gal. There may be a delay of several hours. How is this diagnosed? This condition may be diagnosed based on signs and symptoms of the condition, especially if you have a history of tick bites and a delayed reaction to red meat. You may also have a blood test to check for antibodies to alpha-gal or a skin test to see if there is a reaction to alpha-gal. How is this treated? This condition may be treated by: Avoiding meat and organ meats that may contain alpha-gal. Avoiding medicines or other products that may contain alpha-gal. Using medicines to reduce an allergic reaction. Carrying an epinephrine auto-injector to use in case of a severe AGS reaction. AGS should be treated by an allergist or a health care provider who has experience with AGS. Follow these instructions at home:  Medicines Take over-the-counter and prescription medicines only as told by your health care provider. Follow instructions from your health care provider about when and how to use an epinephrine auto-injector. General instructions Avoid red meat and organ meat, and check food labels for meat-based ingredients in packaged foods such as soup, gravy, and flavoring. Work with your allergist to find what other foods or products you may need to avoid, some people may benefit from avoiding dairy. Keep all follow-up visits. This is important. How is this prevented? Take steps to prevent tick bites as frequent tick bites may increase the risk of an AGS reaction. These steps  include: Avoiding woods and fields with high grass. Wearing clothing protected with the anti-tick chemical permethrin when outdoors in these areas or using a U.S. Environmental Protection Agency-approved insect repellent. Checking your clothing for  ticks before you come back indoors. Checking your body for ticks when you take a shower. Checking your pets for ticks. Where to find more information Centers for Disease Control and Prevention: [employer]Exhibition.com.br [employer] of Allergy and Infectious Diseases: https://hahn.com/ Contact a health care provider if: You have any signs or symptoms of AGS food allergy. You have a tick bite that causes a skin reaction. Get help right away if: You have a severe AGS reaction. You have trouble swallowing or breathing. These symptoms may represent a serious problem that is an emergency. Do not wait to see if the symptoms will go away. Get medical help right away. Call your local emergency services (911 in the [place] Do not drive yourself to the hospital. Summary AGS is a food allergy caused by a tick bite. Red meats, organ meats, and other products or medicines may trigger the alpha-gal reaction. AGS reactions can be mild or severe. If you have AGS, you should not eat red meat, organ meat, or products that may contain alpha-gal. Avoiding tick bites prevents AGS and more serious AGS reactions. This information is not intended to replace advice given to you by your health care provider. Make sure you discuss any questions you have with your health care provider. Document Revised: 11/25/2020 Document Reviewed: 11/25/2020 Elsevier Patient Education  2024 [employer].

## 2024-06-06 NOTE — Progress Notes (Signed)
   Subjective:    Patient ID: Jacob Swanson, male    DOB: 10-14-1950, 73 y.o.   MRN: [id number]  Discussed the use of AI scribe software for clinical note transcription with the patient, who gave verbal consent to proceed.  History of Present Illness   Jacob Swanson is a 73 year old male with alpha-gal syndrome who presents with recurrent allergic reactions.  He experiences recurrent allergic reactions characterized by itching and swelling, described as 'big lumps' on his forehead and other areas. These symptoms occur intermittently, often resolving by the end of the day, and appear every other day. There is no immediate link to food intake. The patient reports that symptoms can occur several hours after eating and may resolve by bedtime.  He has no history of a tick bite. Blood work revealed an elevated IgE level of 209. Specific tests for pork and beef allergies were negative.  He uses Benadryl as needed to manage itching. He stopped his blood pressure medication, homostatin, a week ago due to concerns about its role in his symptoms, but breakouts have persisted since discontinuation.  He switched to almond milk about four to five months ago and is vigilant about checking labels for milk and milk byproducts. He is concerned about the persistent nature of his symptoms and the impact on his quality of life.  No immediate allergic reactions within 20-30 minutes of exposure.           Review of Systems     Objective:    Physical Exam Physical Exam   HEENT: Large white lump on forehead. Lump on posterior head.            Assessment & Plan:  Assessment and Plan    Alpha-gal syndrome Confirmed by elevated IG immune globulin level of 209. Symptoms are intermittent with delayed onset. Broad avoidance of all four-legged mammalian meats and products recommended. - Provided educational materials on alpha-gal syndrome and avoidance strategies. - Advised strict avoidance of all four-legged  mammalian meats and products. - Recommended continued use of Benadryl as needed for itching. - Discussed optional referral to an allergist for further testing if symptoms persist. - Advised monitoring symptoms over the next month and consider allergist referral if symptoms do not resolve.  Hypertension Hypertension management was adjusted due to alpha-gal syndrome concerns. Medication was stopped but symptoms persisted, indicating no contribution from the medication. - Resume blood pressure medication as previously prescribed.

## 2024-07-04 ENCOUNTER — Encounter: Payer: Self-pay | Admitting: Urology

## 2024-07-31 ENCOUNTER — Ambulatory Visit: Payer: Self-pay | Admitting: Family Medicine

## 2024-07-31 ENCOUNTER — Encounter: Payer: Self-pay | Admitting: Family Medicine

## 2024-07-31 VITALS — BP 140/72 | HR 68 | Wt 178.4 lb

## 2024-07-31 DIAGNOSIS — R630 Anorexia: Secondary | ICD-10-CM | POA: Diagnosis not present

## 2024-07-31 DIAGNOSIS — I152 Hypertension secondary to endocrine disorders: Secondary | ICD-10-CM

## 2024-07-31 DIAGNOSIS — E1165 Type 2 diabetes mellitus with hyperglycemia: Secondary | ICD-10-CM

## 2024-07-31 DIAGNOSIS — E1159 Type 2 diabetes mellitus with other circulatory complications: Secondary | ICD-10-CM | POA: Diagnosis not present

## 2024-07-31 LAB — POCT GLYCOSYLATED HEMOGLOBIN (HGB A1C): Hemoglobin A1C: 6.7 % — AB (ref 4.0–5.6)

## 2024-07-31 NOTE — Patient Instructions (Signed)
 Please look into taking creatine monohydrate, please take 5g a day  Please measure your blood pressure morning and evening as discussed.  Please write these down somewhere so that you may bring them to your next appointment when you visit with me or whichever provider you may follow-up with.

## 2024-07-31 NOTE — Progress Notes (Signed)
 Name: Jacob Swanson   Date of Visit: 07/31/24   Date of last visit with me: Visit date not found   CHIEF COMPLAINT:  Chief Complaint  Patient presents with   Follow-up    4 month follow up on alpha-gal has not been eating anything with 4 legs, has not had any flare ups sense.        HPI:  Discussed the use of AI scribe software for clinical note transcription with the patient, who gave verbal consent to proceed.  History of Present Illness   Jacob Swanson is a 73 year old male with hypertension who presents for blood pressure management and follow-up.  He experienced an allergic reaction following a dental procedure involving a tooth extraction and bone graft, leading to a diagnosis of alpha-gal syndrome. Symptoms included hives, a swollen tongue, and numerous outbreaks. His blood pressure medication was temporarily held to assess its impact on his condition, but he resumed the medication after a week.  His blood pressure readings have been high during recent visits, with today's reading at 140/70 mmHg and a previous reading of 144/83 mmHg. He attributes the elevated readings to rushing to the appointment. He does not frequently check his blood pressure at home, although he owns a cuff. He primarily monitors his blood sugar, which he states has been relatively low. He is currently on a low dose of blood pressure medication, which he took later than usual today.  He is actively managing his diet and exercise routine, reporting a good appetite and having enjoyed a 'wonderful Thanksgiving.' He exercises regularly, aiming for an hour to an hour and a half per session, and uses the steam room and sauna twice a week.  He is also on metformin  for blood sugar management, with a previous A1c of 7.9% that has decreased to 7.0%. He is actively trying to manage his diet to maintain these improvements.         OBJECTIVE:       11/23/2023   11:11 AM  Depression screen PHQ 2/9  Decreased  Interest 0  Down, Depressed, Hopeless 0  PHQ - 2 Score 0     BP Readings from Last 3 Encounters:  07/31/24 (!) 140/72  06/06/24 136/72  05/31/24 (!) 148/78    BP (!) 140/72   Pulse 68   Wt 178 lb 6.4 oz (80.9 kg)   SpO2 98%   BMI 27.13 kg/m    Physical Exam   VITALS: BP- 140/70      Physical Exam Constitutional:      Appearance: Normal appearance.  Neurological:     Mental Status: He is alert.     ASSESSMENT/PLAN:   Assessment & Plan Hypertension associated with diabetes (HCC)  Appetite impaired  Type 2 diabetes mellitus with hyperglycemia, without long-term current use of insulin (HCC)    Assessment and Plan    Type 2 diabetes mellitus with hyperglycemia Blood sugar levels improved. HbA1c decreased to 6.7 from 7.9. Diet and routine management effective. - Continue metformin . - Monitor blood sugar levels at home.  Hypertension Blood pressure remains elevated. Current reading 140/70. Medication dose low. Home monitoring infrequent. - Increase dose of current blood pressure medication. - Monitor blood pressure at home more frequently. - Bring home blood pressure cuff to next appointment for calibration check.  Appetite impairment Maintains muscle mass with exercise. - Continue current exercise routine, including gym workouts and sauna use. - Consider creatine supplementation (5 grams daily) to enhance muscle performance.  Murrell Dome A. Vita MD [facility]

## 2024-08-10 ENCOUNTER — Telehealth: Payer: Self-pay | Admitting: Urology

## 2024-08-10 ENCOUNTER — Telehealth: Payer: Self-pay

## 2024-08-10 DIAGNOSIS — N529 Male erectile dysfunction, unspecified: Secondary | ICD-10-CM

## 2024-08-10 MED ORDER — AMBULATORY NON FORMULARY MEDICATION: 0.2000 mL | 5 refills | Status: AC | PRN

## 2024-08-10 NOTE — Telephone Encounter (Signed)
" °  Rx Signed by MD and faxed   Spoke to both patient and pharmacist whom state's Rx last written in February. Pt state's he was sick and never refilled Rx in August. Pt is aware Rx will be re faxed to pharmacy. Voiced understanding "

## 2024-08-10 NOTE — Telephone Encounter (Signed)
 The patient called to request a medication refill of Trimix. Patient would like the medication sent to [facility].  Patient called and they do not have refill from August 2025

## 2024-08-10 NOTE — Telephone Encounter (Signed)
 Tried calling pt with no answer .LVM for return call office.

## 2024-08-28 ENCOUNTER — Ambulatory Visit: Admitting: Family Medicine

## 2024-08-28 VITALS — BP 140/74 | HR 78 | Wt 177.2 lb

## 2024-08-28 DIAGNOSIS — E1159 Type 2 diabetes mellitus with other circulatory complications: Secondary | ICD-10-CM

## 2024-08-28 DIAGNOSIS — I152 Hypertension secondary to endocrine disorders: Secondary | ICD-10-CM

## 2024-08-28 MED ORDER — OLMESARTAN-AMLODIPINE-HCTZ 40-10-12.5 MG PO TABS: 1.0000 | ORAL_TABLET | Freq: Every day | ORAL | 1 refills | Status: AC
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
## 2024-08-28 NOTE — Patient Instructions (Signed)
 VISIT SUMMARY:  You came in today for blood pressure management. Your blood pressure has been elevated recently, which you partly attribute to stress and not measuring it at home during the holiday season. You are currently taking amlodipine  and hydrochlorothiazide for hypertension, and you have been mindful of your sodium intake and hydration. Your weight and A1c levels have been stable.  YOUR PLAN:  -HYPERTENSION ASSOCIATED WITH TYPE 2 DIABETES MELLITUS: Hypertension, or high blood pressure, can lead to long-term damage if not controlled. Your blood pressure was elevated at 147/84 mmHg today, likely due to age-related changes in your blood vessels. We have increased the dosage of your medications, amlodipine  and hydrochlorothiazide, to help manage your blood pressure. Please measure your blood pressure at home regularly and monitor for any symptoms of low blood pressure, such as fatigue or dizziness. Continue with your current lifestyle modifications, including staying hydrated, exercising, and limiting sodium intake. A new prescription has been sent to your pharmacy.  INSTRUCTIONS:  Please follow up with Dr. Joyce in four weeks.

## 2024-09-23 ENCOUNTER — Other Ambulatory Visit: Payer: Self-pay | Admitting: Family Medicine

## 2024-09-23 DIAGNOSIS — E118 Type 2 diabetes mellitus with unspecified complications: Secondary | ICD-10-CM

## 2024-09-24 ENCOUNTER — Telehealth: Payer: Self-pay

## 2024-09-24 ENCOUNTER — Other Ambulatory Visit: Payer: Self-pay | Admitting: Family Medicine

## 2024-09-24 ENCOUNTER — Other Ambulatory Visit: Payer: Self-pay

## 2024-09-24 DIAGNOSIS — E118 Type 2 diabetes mellitus with unspecified complications: Secondary | ICD-10-CM

## 2024-09-24 MED ORDER — LANCETS MISC: 1.0000 | 0 refills | Status: DC

## 2024-09-24 MED ORDER — ACCU-CHEK SOFTCLIX LANCETS MISC: 0 refills | Status: AC

## 2024-09-24 MED ORDER — ACCU-CHEK GUIDE ME W/DEVICE KIT: 1.0000 | PACK | Freq: Every day | 0 refills | Status: AC

## 2024-09-24 MED ORDER — ACCU-CHEK AVIVA PLUS VI STRP: ORAL_STRIP | 1 refills | Status: AC

## 2024-09-24 MED ORDER — LANCET DEVICE MISC: 1.0000 | 0 refills | Status: AC

## 2024-09-24 MED ORDER — ACCU-CHEK GUIDE TEST VI STRP: ORAL_STRIP | 0 refills | Status: AC

## 2024-09-24 MED ORDER — BLOOD GLUCOSE TEST VI STRP: 1.0000 | ORAL_STRIP | 0 refills | Status: DC

## 2024-09-24 MED ORDER — BLOOD GLUCOSE MONITORING SUPPL DEVI: 1.0000 | 0 refills | Status: DC

## 2024-09-26 ENCOUNTER — Ambulatory Visit: Admitting: Family Medicine

## 2024-10-03 ENCOUNTER — Ambulatory Visit: Admitting: Family Medicine

## 2024-11-28 ENCOUNTER — Ambulatory Visit: Payer: Self-pay | Admitting: Family Medicine

## 2025-04-09 ENCOUNTER — Other Ambulatory Visit

## 2025-04-17 ENCOUNTER — Ambulatory Visit: Admitting: Urology
